# Patient Record
Sex: Female | Born: 1960 | Race: Black or African American | Hispanic: No | Marital: Single | Smoking: Never smoker
Health system: Southern US, Community
[De-identification: ages and names within clinical notes are randomized; demographics above are authoritative.]

## PROBLEM LIST (undated history)

## (undated) DIAGNOSIS — I10 Essential (primary) hypertension: Secondary | ICD-10-CM

## (undated) HISTORY — PX: BREAST REDUCTION SURGERY: SHX8

## (undated) HISTORY — PX: ABDOMINAL HYSTERECTOMY: SHX81

---

## 2011-12-13 ENCOUNTER — Emergency Department (HOSPITAL_BASED_OUTPATIENT_CLINIC_OR_DEPARTMENT_OTHER)
Admission: EM | Admit: 2011-12-13 | Discharge: 2011-12-14 | Disposition: A | Discharge status: Home / Self Care | Payer: PRIVATE HEALTH INSURANCE | Attending: Emergency Medicine | Admitting: Emergency Medicine

## 2011-12-13 ENCOUNTER — Encounter (HOSPITAL_BASED_OUTPATIENT_CLINIC_OR_DEPARTMENT_OTHER): Payer: Self-pay | Admitting: *Deleted

## 2011-12-13 DIAGNOSIS — X500XXA Overexertion from strenuous movement or load, initial encounter: Secondary | ICD-10-CM | POA: Insufficient documentation

## 2011-12-13 DIAGNOSIS — Y9289 Other specified places as the place of occurrence of the external cause: Secondary | ICD-10-CM | POA: Insufficient documentation

## 2011-12-13 DIAGNOSIS — I10 Essential (primary) hypertension: Secondary | ICD-10-CM | POA: Insufficient documentation

## 2011-12-13 DIAGNOSIS — Y990 Civilian activity done for income or pay: Secondary | ICD-10-CM | POA: Insufficient documentation

## 2011-12-13 DIAGNOSIS — M549 Dorsalgia, unspecified: Secondary | ICD-10-CM | POA: Insufficient documentation

## 2011-12-13 DIAGNOSIS — E119 Type 2 diabetes mellitus without complications: Secondary | ICD-10-CM | POA: Insufficient documentation

## 2011-12-13 DIAGNOSIS — T148XXA Other injury of unspecified body region, initial encounter: Secondary | ICD-10-CM | POA: Insufficient documentation

## 2011-12-13 DIAGNOSIS — J189 Pneumonia, unspecified organism: Secondary | ICD-10-CM | POA: Insufficient documentation

## 2011-12-13 HISTORY — DX: Essential (primary) hypertension: I10

## 2011-12-13 MED ORDER — TRAMADOL HCL 50 MG PO TABS
50.0000 mg | ORAL_TABLET | Freq: Once | ORAL | Status: AC
  Administered 2011-12-14: 50 mg via ORAL
  Filled 2011-12-13: qty 1

## 2011-12-13 NOTE — ED Provider Notes (Signed)
History  This chart was scribed for [name], MD by Shari Heritage. The patient was seen in room MH10/MH10. Patient's care was started at 2242.   CSN: [id number]  Arrival date & time 12/13/11  2242   None     Chief Complaint  Patient presents with  . Muscle Pain    Patient is a 51 y.o. female presenting with musculoskeletal pain. The history is provided by the patient. No language interpreter was used.  Muscle Pain This is a new problem. The current episode started 2 days ago. The problem occurs constantly. The problem has been gradually worsening. Pertinent negatives include no chest pain, no abdominal pain, no headaches and no shortness of breath. Associated symptoms comments: Not been bed bound no long car trips nor plane trips.  No DOE, no SOB, no f/c/r.  No cough. The symptoms are aggravated by bending and walking. The symptoms are relieved by position (bending to the left side.  ). She has tried nothing for the symptoms. The treatment provided no relief.    Jennifer Ford is a 51 y.o. female who presents to the Emergency Department complaining of intermittent moderate to severe back pain described as a catching sensation below the pposterior right 10-11  ribs onset several days ago while she was pushing a heavy wet mop at work. Patient reports that ambulation and other movements worsens pain leaning to the left side improved the pain.. She denies taking pain medication to improve symptoms. She denies taking any long car or plane trips recently. Patient denies hematuria, SOB, HA, chest pain, abdominal pain and cough.   Past Medical History  Diagnosis Date  . Diabetes mellitus   . Hypertension     Past Surgical History  Procedure Date  . Abdominal hysterectomy     History reviewed. No pertinent family history.  History  Substance Use Topics  . Smoking status: Never Smoker   . Smokeless tobacco: Not on file  . Alcohol Use: No    Review of Systems  Constitutional:  Negative for fever and chills.  HENT: Negative for congestion, sore throat and neck pain.   Eyes: Negative for visual disturbance.  Respiratory: Negative for cough, chest tightness, shortness of breath and wheezing.   Cardiovascular: Negative for chest pain, palpitations and leg swelling.  Gastrointestinal: Negative for nausea, vomiting, abdominal pain and diarrhea.  Genitourinary: Negative for dysuria, urgency and hematuria.  Musculoskeletal: Positive for back pain.  Skin: Negative for rash.  Neurological: Negative for seizures and headaches.  Hematological: Does not bruise/bleed easily.  Psychiatric/Behavioral: Negative for confusion.  All other systems reviewed and are negative.     Allergies  Review of patient's allergies indicates no known allergies.  Home Medications   Current Outpatient Rx  Name Route Sig Dispense Refill  . VITAMIN D 1000 UNITS PO TABS Oral Take 1,000 Units by mouth daily.    Marland Kitchen HYDROCHLOROTHIAZIDE 25 MG PO TABS Oral Take 25 mg by mouth daily.    Marland Kitchen LIDODERM EX Apply externally Apply 1 patch topically daily as needed.    Marland Kitchen METFORMIN HCL 500 MG PO TABS Oral Take 500 mg by mouth 2 (two) times daily with a meal.      Triage Vitals: BP 123/88  Pulse 84  Temp(Src) 97.8 F (36.6 C) (Oral)  Resp 18  Ht 5\' 3"  (1.6 m)  Wt 198 lb (89.812 kg)  BMI 35.07 kg/m2  SpO2 100%  Physical Exam  Constitutional: She is oriented to person, place, and time.  She appears well-developed and well-nourished.  HENT:  Head: Normocephalic and atraumatic.  Mouth/Throat: Oropharynx is clear and moist.       Moist mucus and no exudate.  Eyes: Conjunctivae and EOM are normal.  Neck: Normal range of motion. Neck supple.  Cardiovascular: Normal rate, regular rhythm and normal heart sounds.   Pulmonary/Chest: Breath sounds normal. No respiratory distress. She has no wheezes. She has no rales. She exhibits no tenderness.  Abdominal: Soft. Bowel sounds are normal. There is no  tenderness. There is no rebound and no guarding.  Musculoskeletal: She exhibits no edema.       5/5 upper strength bi step offs over the spine.    Neurological: She is alert and oriented to person, place, and time. She has normal reflexes.  Skin: Skin is warm and dry.  Psychiatric: She has a normal mood and affect. Her behavior is normal.    ED Course  Procedures (including critical care time)  DIAGNOSTIC STUDIES: Oxygen Saturation is 100% on room air, normal by my interpretation.    COORDINATION OF CARE: 11:34PM-Discussed x-ray and urinalysis with pt and pt agreed.  Labs Reviewed  URINALYSIS, ROUTINE W REFLEX MICROSCOPIC - Abnormal; Notable for the following:    APPearance TURBID (*)    All other components within normal limits  URINE MICROSCOPIC-ADD ON - Abnormal; Notable for the following:    Squamous Epithelial / LPF MANY (*)    All other components within normal limits   No results found.   No diagnosis found.    MDM  Will treat for muscle strain and pneumonia.  Return immediately for chest pain shortness of breath cough fevers chills or any concerns.  Follow up with your family doctor.  Patient verbalizes understanding and agrees to follow up  I personally performed the services described in this documentation, which was scribed in my presence. The recorded information has been reviewed and considered.         Jasmine Awe, MD 12/14/11 [phone]

## 2011-12-13 NOTE — ED Notes (Signed)
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 Pain increased with movement. Denies other s/s.

## 2011-12-13 NOTE — ED Notes (Signed)
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  Pt has no obvious injuries or deformites noted

## 2011-12-14 ENCOUNTER — Emergency Department (HOSPITAL_BASED_OUTPATIENT_CLINIC_OR_DEPARTMENT_OTHER): Payer: PRIVATE HEALTH INSURANCE

## 2011-12-14 ENCOUNTER — Emergency Department (INDEPENDENT_AMBULATORY_CARE_PROVIDER_SITE_OTHER): Payer: PRIVATE HEALTH INSURANCE

## 2011-12-14 DIAGNOSIS — M549 Dorsalgia, unspecified: Secondary | ICD-10-CM

## 2011-12-14 DIAGNOSIS — R918 Other nonspecific abnormal finding of lung field: Secondary | ICD-10-CM

## 2011-12-14 LAB — URINALYSIS, ROUTINE W REFLEX MICROSCOPIC
Glucose, UA: NEGATIVE mg/dL
Hgb urine dipstick: NEGATIVE
Specific Gravity, Urine: 1.018 (ref 1.005–1.030)
Urobilinogen, UA: 1 mg/dL (ref 0.0–1.0)
pH: 7 (ref 5.0–8.0)

## 2011-12-14 LAB — URINE MICROSCOPIC-ADD ON

## 2011-12-14 MED ORDER — OXYCODONE-ACETAMINOPHEN 5-325 MG PO TABS: 1.0000 | ORAL_TABLET | ORAL | Status: AC | PRN

## 2011-12-14 MED ORDER — AZITHROMYCIN 250 MG PO TABS
ORAL_TABLET | ORAL | Status: AC
Start: 2011-12-14 — End: 2011-12-19

## 2011-12-14 NOTE — Discharge Instructions (Signed)
Pneumonia, Adult Pneumonia is an infection of the lungs.  CAUSES Pneumonia may be caused by bacteria or a virus. Usually, these infections are caused by breathing infectious particles into the lungs (respiratory tract). SYMPTOMS   Cough.   Fever.   Chest pain.   Increased rate of breathing.   Wheezing.   Mucus production.  DIAGNOSIS  If you have the common symptoms of pneumonia, your caregiver will typically confirm the diagnosis with a chest X-ray. The X-ray will show an abnormality in the lung (pulmonary infiltrate) if you have pneumonia. Other tests of your blood, urine, or sputum may be done to find the specific cause of your pneumonia. Your caregiver may also do tests (blood gases or pulse oximetry) to see how well your lungs are working. TREATMENT  Some forms of pneumonia may be spread to other people when you cough or sneeze. You may be asked to wear a mask before and during your exam. Pneumonia that is caused by bacteria is treated with antibiotic medicine. Pneumonia that is caused by the influenza virus may be treated with an antiviral medicine. Most other viral infections must run their course. These infections will not respond to antibiotics.  PREVENTION A pneumococcal shot (vaccine) is available to prevent a common bacterial cause of pneumonia. This is usually suggested for:  People over 65 years old.   Patients on chemotherapy.   People with chronic lung problems, such as bronchitis or emphysema.   People with immune system problems.  If you are over 65 or have a high risk condition, you may receive the pneumococcal vaccine if you have not received it before. In some countries, a routine influenza vaccine is also recommended. This vaccine can help prevent some cases of pneumonia.You may be offered the influenza vaccine as part of your care. If you smoke, it is time to quit. You may receive instructions on how to stop smoking. Your caregiver can provide medicines and  counseling to help you quit. HOME CARE INSTRUCTIONS   Cough suppressants may be used if you are losing too much rest. However, coughing protects you by clearing your lungs. You should avoid using cough suppressants if you can.   Your caregiver may have prescribed medicine if he or she thinks your pneumonia is caused by a bacteria or influenza. Finish your medicine even if you start to feel better.   Your caregiver may also prescribe an expectorant. This loosens the mucus to be coughed up.   Only take over-the-counter or prescription medicines for pain, discomfort, or fever as directed by your caregiver.   Do not smoke. Smoking is a common cause of bronchitis and can contribute to pneumonia. If you are a smoker and continue to smoke, your cough may last several weeks after your pneumonia has cleared.   A cold steam vaporizer or humidifier in your room or home may help loosen mucus.   Coughing is often worse at night. Sleeping in a semi-upright position in a recliner or using a couple pillows under your head will help with this.   Get rest as you feel it is needed. Your body will usually let you know when you need to rest.  SEEK IMMEDIATE MEDICAL CARE IF:   Your illness becomes worse. This is especially true if you are elderly or weakened from any other disease.   You cannot control your cough with suppressants and are losing sleep.   You begin coughing up blood.   You develop pain which is getting worse or   is uncontrolled with medicines.   You have a fever.   Any of the symptoms which initially brought you in for treatment are getting worse rather than better.   You develop shortness of breath or chest pain.  MAKE SURE YOU:   Understand these instructions.   Will watch your condition.   Will get help right away if you are not doing well or get worse.  Document Released: 08/03/2005 Document Revised: 07/23/2011 Document Reviewed: 10/23/2010 [facility] Patient Information 2012  [place], [place].Ligament Sprain Ligaments are tough, fibrous tissues that hold bones together at the joints. A sprain can occur when a ligament is stretched. This injury may take several weeks to heal. HOME CARE INSTRUCTIONS   Rest the injured area for as long as directed by your caregiver. Then slowly start using the joint as directed by your caregiver and as the pain allows.   Keep the affected joint raised if possible to lessen swelling.   Apply ice for 15 to 20 minutes to the injured area every couple hours for the first half day, then 3 to 4 times per day for the first 48 hours. Put the ice in a plastic bag and place a towel between the bag of ice and your skin.   Wear any splinting, casting, or elastic bandage applications as instructed.   Only take over-the-counter or prescription medicines for pain, discomfort, or fever as directed by your caregiver. Do not use aspirin immediately after the injury unless instructed by your caregiver. Aspirin can cause increased bleeding and bruising of the tissues.   If you were given crutches, continue to use them as instructed and do not resume weight bearing on the affected extremity until instructed.  SEEK MEDICAL CARE IF:   Your bruising, swelling, or pain increases.   You have cold and numb fingers or toes if your arm or leg was injured.  SEEK IMMEDIATE MEDICAL CARE IF:   Your toes are numb or blue if your leg was injured.   Your fingers are numb or blue if your arm was injured.   Your pain is not responding to medicines and continues to stay the same or gets worse.  MAKE SURE YOU:   Understand these instructions.   Will watch your condition.   Will get help right away if you are not doing well or get worse.  Document Released: 07/31/2000 Document Revised: 07/23/2011 Document Reviewed: 05/29/2008 [employer] Patient Information 2012 [place], [place].

## 2013-11-05 IMAGING — CR DG CHEST 2V
2 series · 2 of 2 positions shown · non-contrast
Comparison: None.

CLINICAL DATA: Right-sided back pain.

CHEST - 2 VIEW

[w chest pa]
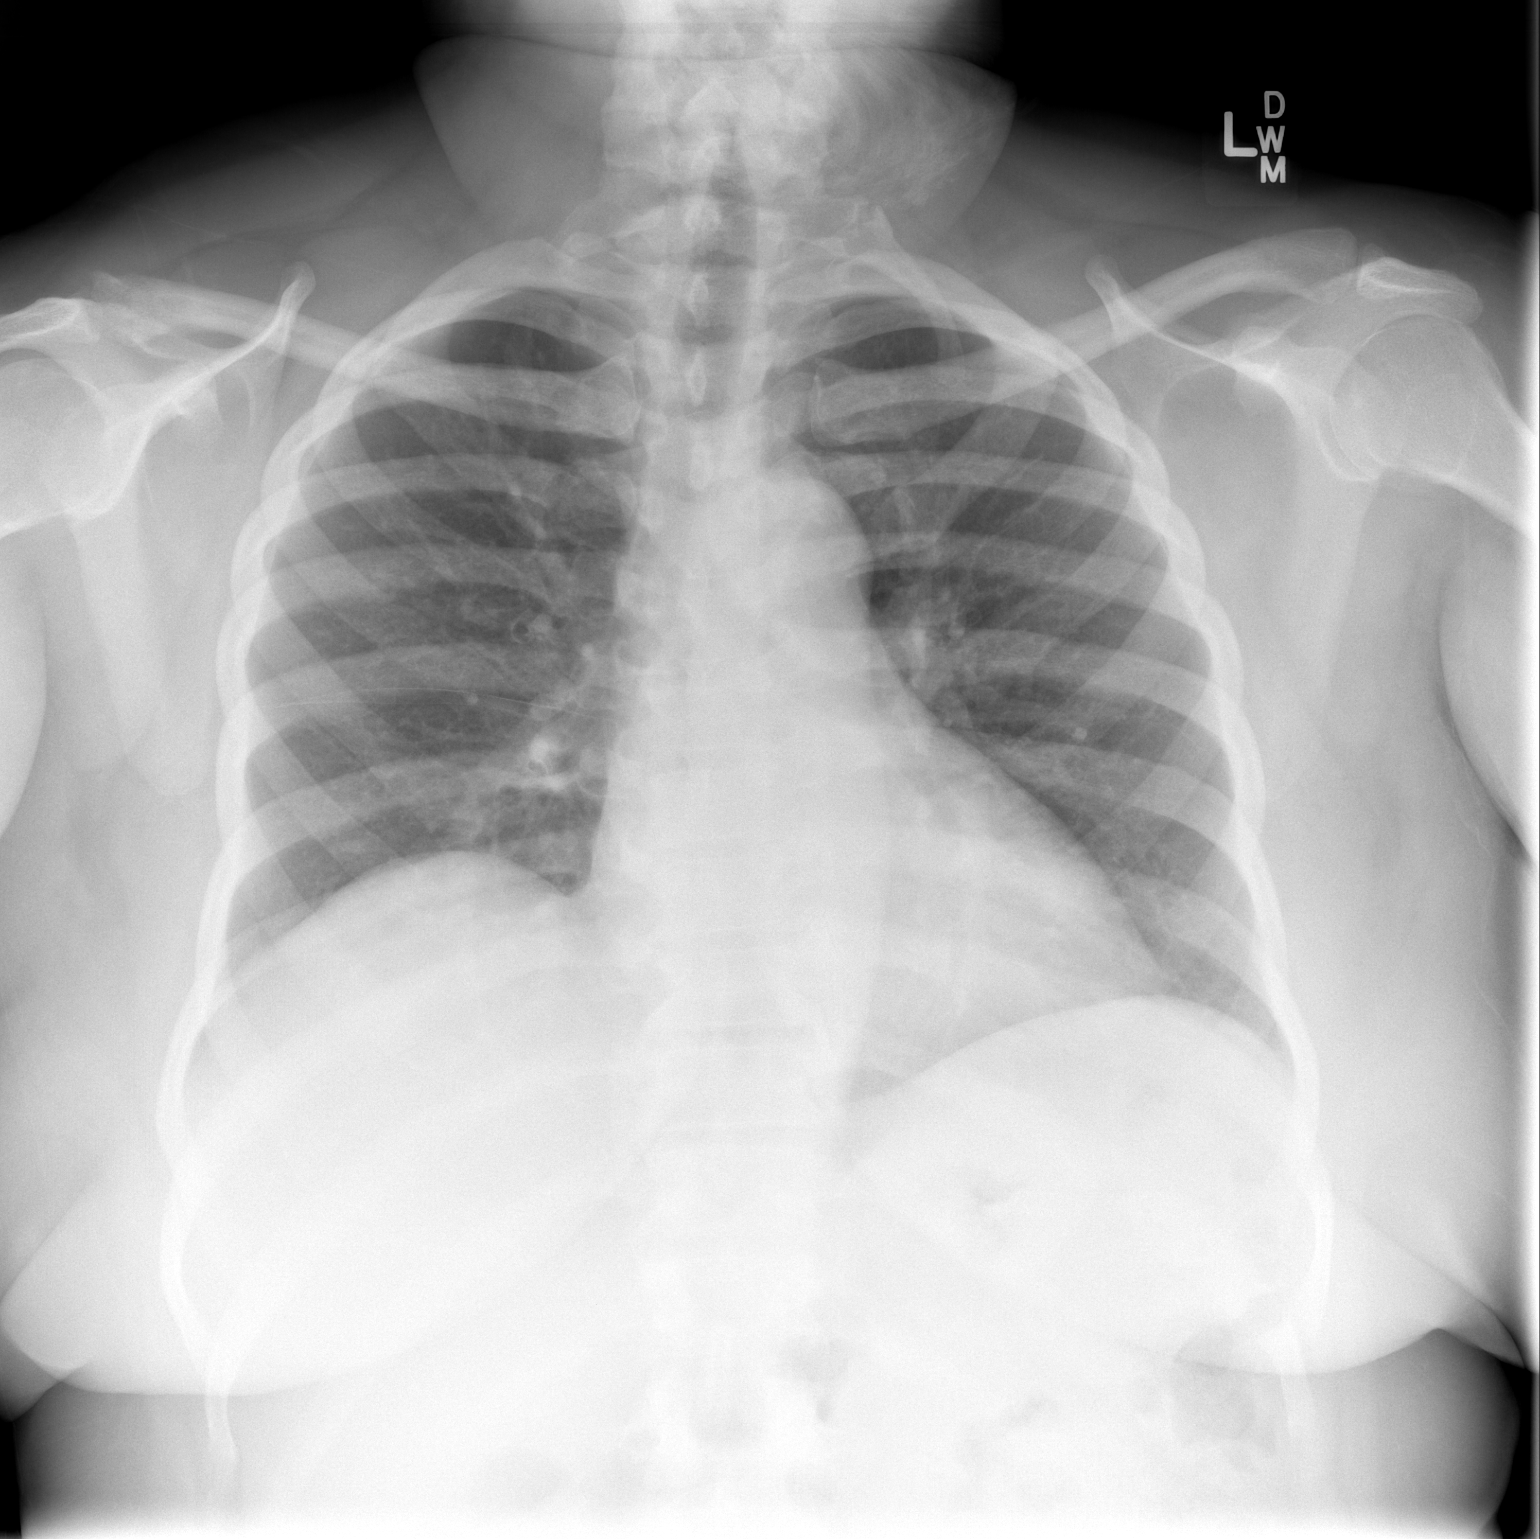

[w chest lat]
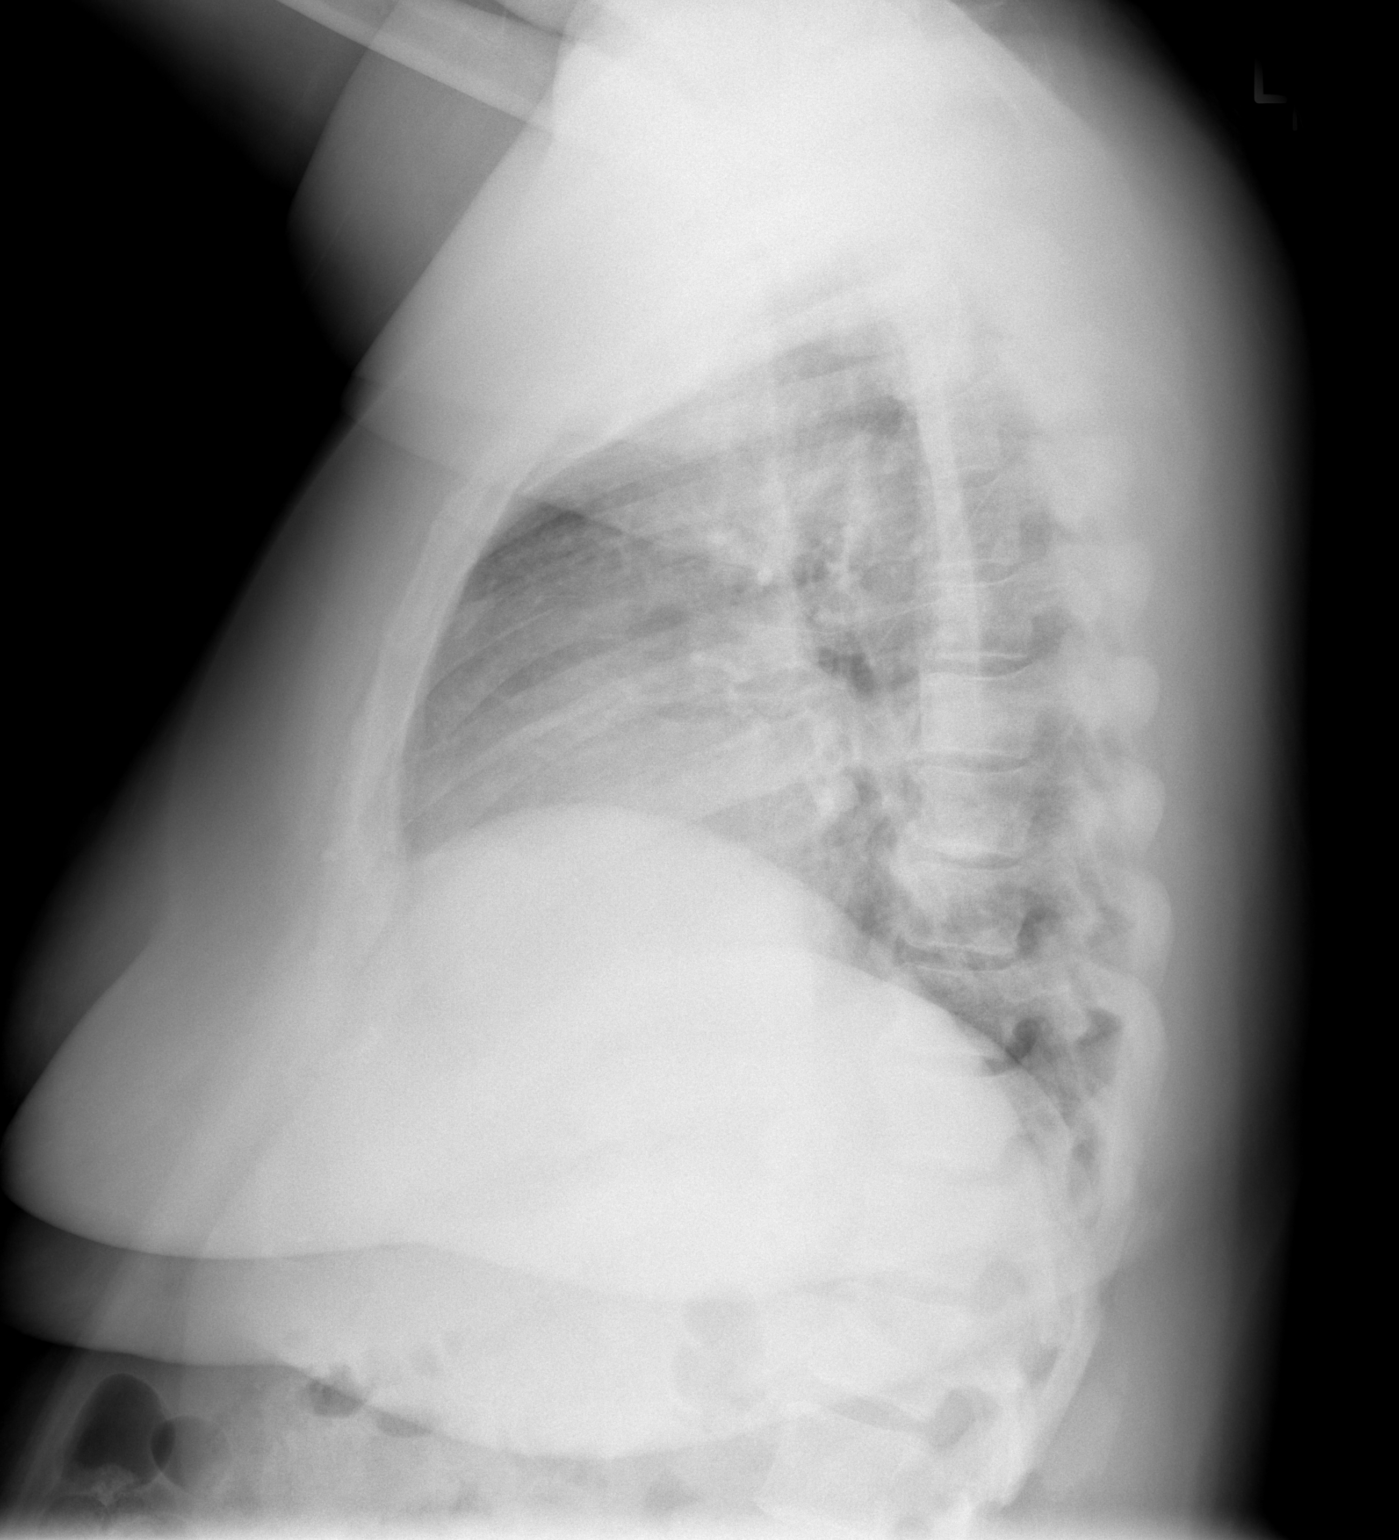

[2 of 2 positions shown; findings below may reference images not displayed]

FINDINGS: Hypoaeration with interstitial crowding.  Mild streaky
retrocardiac opacity.  No pleural effusion or pneumothorax.
Cervical ribs. No acute osseous abnormality identified.
IMPRESSION: Mild retrocardiac opacity, likely atelectasis.  Early infiltrate
not excluded in the appropriate clinical setting.

## 2015-10-24 ENCOUNTER — Encounter (HOSPITAL_COMMUNITY): Payer: Self-pay | Admitting: Emergency Medicine

## 2015-10-24 ENCOUNTER — Emergency Department (HOSPITAL_COMMUNITY)
Admission: EM | Admit: 2015-10-24 | Discharge: 2015-10-24 | Disposition: A | Discharge status: Home / Self Care | Payer: Self-pay | Attending: Emergency Medicine | Admitting: Emergency Medicine

## 2015-10-24 ENCOUNTER — Emergency Department (HOSPITAL_COMMUNITY): Payer: Self-pay

## 2015-10-24 DIAGNOSIS — Y9289 Other specified places as the place of occurrence of the external cause: Secondary | ICD-10-CM | POA: Insufficient documentation

## 2015-10-24 DIAGNOSIS — M545 Low back pain, unspecified: Secondary | ICD-10-CM

## 2015-10-24 DIAGNOSIS — Y998 Other external cause status: Secondary | ICD-10-CM | POA: Insufficient documentation

## 2015-10-24 DIAGNOSIS — E119 Type 2 diabetes mellitus without complications: Secondary | ICD-10-CM | POA: Insufficient documentation

## 2015-10-24 DIAGNOSIS — Z7984 Long term (current) use of oral hypoglycemic drugs: Secondary | ICD-10-CM | POA: Insufficient documentation

## 2015-10-24 DIAGNOSIS — I10 Essential (primary) hypertension: Secondary | ICD-10-CM | POA: Insufficient documentation

## 2015-10-24 DIAGNOSIS — R55 Syncope and collapse: Secondary | ICD-10-CM | POA: Insufficient documentation

## 2015-10-24 DIAGNOSIS — Y9389 Activity, other specified: Secondary | ICD-10-CM | POA: Insufficient documentation

## 2015-10-24 DIAGNOSIS — Z79899 Other long term (current) drug therapy: Secondary | ICD-10-CM | POA: Insufficient documentation

## 2015-10-24 DIAGNOSIS — S3992XA Unspecified injury of lower back, initial encounter: Secondary | ICD-10-CM | POA: Insufficient documentation

## 2015-10-24 DIAGNOSIS — W1839XA Other fall on same level, initial encounter: Secondary | ICD-10-CM | POA: Insufficient documentation

## 2015-10-24 DIAGNOSIS — W19XXXA Unspecified fall, initial encounter: Secondary | ICD-10-CM

## 2015-10-24 MED ORDER — KETOROLAC TROMETHAMINE 30 MG/ML IJ SOLN
30.0000 mg | Freq: Once | INTRAMUSCULAR | Status: AC
  Administered 2015-10-24: 30 mg via INTRAMUSCULAR
  Filled 2015-10-24: qty 1

## 2015-10-24 MED ORDER — HYDROMORPHONE HCL 1 MG/ML IJ SOLN
1.0000 mg | Freq: Once | INTRAMUSCULAR | Status: AC
  Administered 2015-10-24: 1 mg via INTRAMUSCULAR
  Filled 2015-10-24: qty 1

## 2015-10-24 MED ORDER — TRAMADOL HCL 50 MG PO TABS: 50.0000 mg | ORAL_TABLET | Freq: Four times a day (QID) | ORAL | Status: DC | PRN

## 2015-10-24 MED ORDER — DIAZEPAM 2 MG PO TABS
2.0000 mg | ORAL_TABLET | Freq: Once | ORAL | Status: AC
  Administered 2015-10-24: 2 mg via ORAL
  Filled 2015-10-24: qty 1

## 2015-10-24 NOTE — Discharge Instructions (Signed)

## 2015-10-24 NOTE — ED Notes (Signed)
Bed: [id number] Expected date:  Expected time:  Means of arrival:  Comments: EMS- 54yo F, fall

## 2015-10-24 NOTE — ED Notes (Addendum)
Per EMS c/o low back pain/coccyx pain after pt fell at 0500 today backwards, pt was seen by EMS at that time and only had lower back pain. At 0900 patient needed to lay on floor due to pain and could not stand up so called EMS.   In contrast to EMS report, pt states syncope was cause of fall, states she hit her head and lost consciousness for 3 minutes, witnessed by family member.

## 2015-10-28 NOTE — ED Provider Notes (Signed)
CSN: [id number]     Arrival date & time 10/24/15  4098 History   First MD Initiated Contact with Patient 10/24/15 1034     Chief Complaint  Patient presents with  . Fall  . Loss of Consciousness     (Consider location/radiation/quality/duration/timing/severity/associated sxs/prior Treatment) HPI 55 year old female with lower back pain. Patient followed approximately 5:00 this morning. She fell backwards onto her butt. She has had persistent lower back and buttock pain since then. Pain is sniffily worse with any type of movement. She does not think she hit her head. Denies any significant headache, neck pain. No acute numbness, tingling or focal loss of strength. No intervention prior to arrival.  Past Medical History  Diagnosis Date  . Diabetes mellitus   . Hypertension    Past Surgical History  Procedure Laterality Date  . Abdominal hysterectomy     History reviewed. No pertinent family history. Social History  Substance Use Topics  . Smoking status: Never Smoker   . Smokeless tobacco: None  . Alcohol Use: No   OB History    No data available     Review of Systems  All systems reviewed and negative, other than as noted in HPI.   Allergies  Review of patient's allergies indicates no known allergies.  Home Medications   Prior to Admission medications   Medication Sig Start Date End Date Taking? Authorizing Provider  cholecalciferol (VITAMIN D) 1000 UNITS tablet Take 1,000 Units by mouth daily.   Yes Historical Provider, MD  efavirenz-emtricitabine-tenofovir (ATRIPLA) 600-200-300 MG tablet Take 1 tablet by mouth at bedtime.   Yes Historical Provider, MD  glipiZIDE (GLUCOTROL) 5 MG tablet Take 5 mg by mouth 2 (two) times daily.    Yes Historical Provider, MD  hydrochlorothiazide (HYDRODIURIL) 25 MG tablet Take 25 mg by mouth daily.   Yes Historical Provider, MD  lisinopril (PRINIVIL,ZESTRIL) 5 MG tablet Take 5 mg by mouth daily.   Yes Historical Provider, MD  metFORMIN  (GLUCOPHAGE) 500 MG tablet Take 1,000 mg by mouth 2 (two) times daily with a meal.    Yes Historical Provider, MD  Multiple Vitamin (MULTIVITAMIN WITH MINERALS) TABS tablet Take 1 tablet by mouth daily.   Yes Historical Provider, MD  pravastatin (PRAVACHOL) 10 MG tablet Take 10 mg by mouth daily.   Yes Historical Provider, MD  traMADol (ULTRAM) 50 MG tablet Take 1 tablet (50 mg total) by mouth every 6 (six) hours as needed. 10/24/15   Raeford Razor, MD   BP 91/55 mmHg  Pulse 66  Temp(Src) 97.8 F (36.6 C) (Oral)  Resp 20  SpO2 98% Physical Exam  Constitutional: She appears well-developed and well-nourished. No distress.  HENT:  Head: Normocephalic and atraumatic.  Eyes: Conjunctivae are normal. Right eye exhibits no discharge. Left eye exhibits no discharge.  Neck: Neck supple.  Cardiovascular: Normal rate, regular rhythm and normal heart sounds.  Exam reveals no gallop and no friction rub.   No murmur heard. Pulmonary/Chest: Effort normal and breath sounds normal. No respiratory distress.  Abdominal: Soft. She exhibits no distension. There is no tenderness.  Musculoskeletal: She exhibits no edema or tenderness.  Mild tenderness to palpation lumbar spine and sacrum. No step-off or deformity. Overlying skin lesions. Strength is 5 out of 5 bilateral lower extremities. Sensation is intact to light touch. 1+ patellar reflexes bilaterally.  Neurological: She is alert.  Skin: Skin is warm and dry.  Psychiatric: She has a normal mood and affect. Her behavior is normal. Thought content normal.  Nursing note and vitals reviewed.   ED Course  Procedures (including critical care time) Labs Review Labs Reviewed - No data to display  Imaging Review No results found. I have personally reviewed and evaluated these images and lab results as part of my medical decision-making.   EKG Interpretation   Date/Time:  Thursday October 24 2015 10:26:52 EST Ventricular Rate:  78 PR Interval:  164 QRS  Duration: 94 QT Interval:  388 QTC Calculation: 442 R Axis:   -64 Text Interpretation:  Sinus rhythm Left anterior fascicular block Abnormal  R-wave progression, late transition ED PHYSICIAN INTERPRETATION AVAILABLE  IN [facility] Confirmed by TEST, Record ([id number]) on 10/25/2015 6:56:11  AM      MDM   Final diagnoses:  Fall, initial encounter  Right-sided low back pain without sciatica        Raeford RazorStephen Deeann Servidio, MD 10/28/15 [phone]

## 2019-10-17 ENCOUNTER — Ambulatory Visit: Payer: Self-pay

## 2019-10-19 ENCOUNTER — Ambulatory Visit: Payer: Self-pay | Attending: Internal Medicine

## 2019-10-19 DIAGNOSIS — Z23 Encounter for immunization: Secondary | ICD-10-CM | POA: Insufficient documentation

## 2019-10-19 NOTE — Progress Notes (Signed)
   Covid-19 Vaccination Clinic  Name:  Jennifer Ford    MRN: [id number] DOB: 09-28-1960  10/19/2019  Jennifer Ford was observed post Covid-19 immunization for 15 minutes without incident. She was provided with Vaccine Information Sheet and instruction to access the V-Safe system.   Jennifer Ford was instructed to call 911 with any severe reactions post vaccine: Marland Kitchen Difficulty breathing  . Swelling of face and throat  . A fast heartbeat  . A bad rash all over body  . Dizziness and weakness   Immunizations Administered    Name Date Dose VIS Date Route   Pfizer COVID-19 Vaccine 10/19/2019  3:34 PM 0.3 mL 07/28/2019 Intramuscular   Manufacturer: [employer], [employer]   Lot: [id number]   NDC: [address]-0

## 2019-11-15 ENCOUNTER — Ambulatory Visit: Payer: Self-pay | Attending: Internal Medicine

## 2019-11-15 DIAGNOSIS — Z23 Encounter for immunization: Secondary | ICD-10-CM

## 2019-11-15 NOTE — Progress Notes (Signed)
   Covid-19 Vaccination Clinic  Name:  Jennifer Ford    MRN: [id number] DOB: [birth year]  11/15/2019  [name] was observed post Covid-19 immunization for 15 minutes without incident. She was provided with Vaccine Information Sheet and instruction to access the V-Safe system.   [name] was instructed to call 911 with any severe reactions post vaccine: Marland Kitchen Difficulty breathing  . Swelling of face and throat  . A fast heartbeat  . A bad rash all over body  . Dizziness and weakness   Immunizations Administered    Name Date Dose VIS Date Route   Pfizer COVID-19 Vaccine 11/15/2019  8:25 AM 0.3 mL 07/28/2019 Intramuscular   Manufacturer: [employer], [employer]   Lot: [id number]   NDC: [address]-1

## 2020-01-29 ENCOUNTER — Ambulatory Visit: Payer: Self-pay | Admitting: Family Medicine

## 2020-02-29 ENCOUNTER — Other Ambulatory Visit: Payer: Self-pay

## 2020-02-29 ENCOUNTER — Other Ambulatory Visit: Payer: Self-pay | Admitting: Internal Medicine

## 2020-02-29 ENCOUNTER — Encounter: Payer: Self-pay | Admitting: Internal Medicine

## 2020-02-29 ENCOUNTER — Ambulatory Visit: Payer: Self-pay | Attending: Family Medicine | Admitting: Internal Medicine

## 2020-02-29 VITALS — BP 127/76 | HR 84 | Resp 16 | Ht 62.0 in | Wt 209.2 lb

## 2020-02-29 DIAGNOSIS — I10 Essential (primary) hypertension: Secondary | ICD-10-CM

## 2020-02-29 DIAGNOSIS — Z1331 Encounter for screening for depression: Secondary | ICD-10-CM

## 2020-02-29 DIAGNOSIS — Z1211 Encounter for screening for malignant neoplasm of colon: Secondary | ICD-10-CM

## 2020-02-29 DIAGNOSIS — Z6838 Body mass index (BMI) 38.0-38.9, adult: Secondary | ICD-10-CM

## 2020-02-29 DIAGNOSIS — Z21 Asymptomatic human immunodeficiency virus [HIV] infection status: Secondary | ICD-10-CM | POA: Insufficient documentation

## 2020-02-29 DIAGNOSIS — E1159 Type 2 diabetes mellitus with other circulatory complications: Secondary | ICD-10-CM | POA: Insufficient documentation

## 2020-02-29 DIAGNOSIS — E785 Hyperlipidemia, unspecified: Secondary | ICD-10-CM

## 2020-02-29 DIAGNOSIS — I152 Hypertension secondary to endocrine disorders: Secondary | ICD-10-CM | POA: Insufficient documentation

## 2020-02-29 DIAGNOSIS — E1169 Type 2 diabetes mellitus with other specified complication: Secondary | ICD-10-CM

## 2020-02-29 DIAGNOSIS — R809 Proteinuria, unspecified: Secondary | ICD-10-CM

## 2020-02-29 DIAGNOSIS — E1142 Type 2 diabetes mellitus with diabetic polyneuropathy: Secondary | ICD-10-CM

## 2020-02-29 DIAGNOSIS — E1129 Type 2 diabetes mellitus with other diabetic kidney complication: Secondary | ICD-10-CM

## 2020-02-29 LAB — POCT GLYCOSYLATED HEMOGLOBIN (HGB A1C): HbA1c, POC (controlled diabetic range): 10.2 % — AB (ref 0.0–7.0)

## 2020-02-29 LAB — GLUCOSE, POCT (MANUAL RESULT ENTRY): POC Glucose: 345 mg/dl — AB (ref 70–99)

## 2020-02-29 MED ORDER — TRUE METRIX BLOOD GLUCOSE TEST VI STRP: ORAL_STRIP | 12 refills | Status: DC

## 2020-02-29 MED ORDER — LANTUS SOLOSTAR 100 UNIT/ML ~~LOC~~ SOPN: 12.0000 [IU] | PEN_INJECTOR | Freq: Every day | SUBCUTANEOUS | 99 refills | Status: DC

## 2020-02-29 MED ORDER — TRUE METRIX METER W/DEVICE KIT: PACK | 0 refills | Status: DC

## 2020-02-29 MED ORDER — PEN NEEDLES 31G X 8 MM MISC: 6 refills | Status: DC

## 2020-02-29 MED ORDER — TRUEPLUS LANCETS 28G MISC
4 refills | Status: DC
Start: 2020-02-29 — End: 2021-08-22

## 2020-02-29 MED FILL — TRUEplus LANCETS 28G MISC: 25 days supply | Qty: 100 | Fill #0

## 2020-02-29 MED FILL — !LANTUS SOLOSTAR 100UNITS/M: 100 | 25 days supply | Qty: 3 | Fill #0

## 2020-02-29 MED FILL — !TRUE METRIX BLOOD GLUCOSE: 30 days supply | Qty: 1 | Fill #0

## 2020-02-29 MED FILL — TRUEPLUS PEN NDL 31GX5/16: 31G X 8 MM | 25 days supply | Qty: 100 | Fill #0

## 2020-02-29 MED FILL — TRUE METRIX TEST STRIP: 25 days supply | Qty: 100 | Fill #0

## 2020-02-29 NOTE — Patient Instructions (Signed)
Stop glipizide. Start Lantus insulin 12 units at bedtime. Check your blood sugars before breakfast and before dinner.  Blood sugar goals before meals is between 90-130.  If you check after meal, check 2 hours after.  The goal 2 hours after meal is less than 180.   Diabetes Mellitus and Nutrition, Adult When you have diabetes (diabetes mellitus), it is very important to have healthy eating habits because your blood sugar (glucose) levels are greatly affected by what you eat and drink. Eating healthy foods in the appropriate amounts, at about the same times every day, can help you:  Control your blood glucose.  Lower your risk of heart disease.  Improve your blood pressure.  Reach or maintain a healthy weight. Every person with diabetes is different, and each person has different needs for a meal plan. Your health care provider may recommend that you work with a diet and nutrition specialist (dietitian) to make a meal plan that is best for you. Your meal plan may vary depending on factors such as:  The calories you need.  The medicines you take.  Your weight.  Your blood glucose, blood pressure, and cholesterol levels.  Your activity level.  Other health conditions you have, such as heart or kidney disease. How do carbohydrates affect me? Carbohydrates, also called carbs, affect your blood glucose level more than any other type of food. Eating carbs naturally raises the amount of glucose in your blood. Carb counting is a method for keeping track of how many carbs you eat. Counting carbs is important to keep your blood glucose at a healthy level, especially if you use insulin or take certain oral diabetes medicines. It is important to know how many carbs you can safely have in each meal. This is different for every person. Your dietitian can help you calculate how many carbs you should have at each meal and for each snack. Foods that contain carbs include:  Bread, cereal, rice, pasta,  and crackers.  Potatoes and corn.  Peas, beans, and lentils.  Milk and yogurt.  Fruit and juice.  Desserts, such as cakes, cookies, ice cream, and candy. How does alcohol affect me? Alcohol can cause a sudden decrease in blood glucose (hypoglycemia), especially if you use insulin or take certain oral diabetes medicines. Hypoglycemia can be a life-threatening condition. Symptoms of hypoglycemia (sleepiness, dizziness, and confusion) are similar to symptoms of having too much alcohol. If your health care provider says that alcohol is safe for you, follow these guidelines:  Limit alcohol intake to no more than 1 drink per day for nonpregnant women and 2 drinks per day for men. One drink equals 12 oz of beer, 5 oz of wine, or 1 oz of hard liquor.  Do not drink on an empty stomach.  Keep yourself hydrated with water, diet soda, or unsweetened iced tea.  Keep in mind that regular soda, juice, and other mixers may contain a lot of sugar and must be counted as carbs. What are tips for following this plan?  Reading food labels  Start by checking the serving size on the "Nutrition Facts" label of packaged foods and drinks. The amount of calories, carbs, fats, and other nutrients listed on the label is based on one serving of the item. Many items contain more than one serving per package.  Check the total grams (g) of carbs in one serving. You can calculate the number of servings of carbs in one serving by dividing the total carbs by 15. For  example, if a food has 30 g of total carbs, it would be equal to 2 servings of carbs.  Check the number of grams (g) of saturated and trans fats in one serving. Choose foods that have low or no amount of these fats.  Check the number of milligrams (mg) of salt (sodium) in one serving. Most people should limit total sodium intake to less than 2,300 mg per day.  Always check the nutrition information of foods labeled as "low-fat" or "nonfat". These foods  may be higher in added sugar or refined carbs and should be avoided.  Talk to your dietitian to identify your daily goals for nutrients listed on the label. Shopping  Avoid buying canned, premade, or processed foods. These foods tend to be high in fat, sodium, and added sugar.  Shop around the outside edge of the grocery store. This includes fresh fruits and vegetables, bulk grains, fresh meats, and fresh dairy. Cooking  Use low-heat cooking methods, such as baking, instead of high-heat cooking methods like deep frying.  Cook using healthy oils, such as olive, canola, or sunflower oil.  Avoid cooking with butter, cream, or high-fat meats. Meal planning  Eat meals and snacks regularly, preferably at the same times every day. Avoid going long periods of time without eating.  Eat foods high in fiber, such as fresh fruits, vegetables, beans, and whole grains. Talk to your dietitian about how many servings of carbs you can eat at each meal.  Eat 4-6 ounces (oz) of lean protein each day, such as lean meat, chicken, fish, eggs, or tofu. One oz of lean protein is equal to: ? 1 oz of meat, chicken, or fish. ? 1 egg. ?  cup of tofu.  Eat some foods each day that contain healthy fats, such as avocado, nuts, seeds, and fish. Lifestyle  Check your blood glucose regularly.  Exercise regularly as told by your health care provider. This may include: ? 150 minutes of moderate-intensity or vigorous-intensity exercise each week. This could be brisk walking, biking, or water aerobics. ? Stretching and doing strength exercises, such as yoga or weightlifting, at least 2 times a week.  Take medicines as told by your health care provider.  Do not use any products that contain nicotine or tobacco, such as cigarettes and e-cigarettes. If you need help quitting, ask your health care provider.  Work with a Social worker or diabetes educator to identify strategies to manage stress and any emotional and social  challenges. Questions to ask a health care provider  Do I need to meet with a diabetes educator?  Do I need to meet with a dietitian?  What number can I call if I have questions?  When are the best times to check my blood glucose? Where to find more information:  American Diabetes Association: diabetes.org  Academy of Nutrition and Dietetics: www.eatright.[employer] of Diabetes and Digestive and Kidney Diseases (NIH): [employer] Summary  A healthy meal plan will help you control your blood glucose and maintain a healthy lifestyle.  Working with a diet and nutrition specialist (dietitian) can help you make a meal plan that is best for you.  Keep in mind that carbohydrates (carbs) and alcohol have immediate effects on your blood glucose levels. It is important to count carbs and to use alcohol carefully. This information is not intended to replace advice given to you by your health care provider. Make sure you discuss any questions you have with your health care provider. Document Revised:  07/16/2017 Document Reviewed: 09/07/2016 Elsevier Patient Education  2020 Elsevier Inc.   Diabetes Mellitus and Standards of Medical Care Managing diabetes (diabetes mellitus) can be complicated. Your diabetes treatment may be managed by a team of health care providers, including:  A physician who specializes in diabetes (endocrinologist).  A nurse practitioner or physician assistant.  Nurses.  A diet and nutrition specialist (registered dietitian).  A certified diabetes educator (CDE).  An exercise specialist.  A pharmacist.  An eye doctor.  A foot specialist (podiatrist).  A dentist.  A primary care provider.  A mental health provider. Your health care providers follow guidelines to help you get the best quality of care. The following schedule is a general guideline for your diabetes management plan. Your health care providers may give you more specific  instructions. Physical exams Upon being diagnosed with diabetes mellitus, and each year after that, your health care provider will ask about your medical and family history. He or she will also do a physical exam. Your exam may include:  Measuring your height, weight, and body mass index (BMI).  Checking your blood pressure. This will be done at every routine medical visit. Your target blood pressure may vary depending on your medical conditions, your age, and other factors.  Thyroid gland exam.  Skin exam.  Screening for damage to your nerves (peripheral neuropathy). This may include checking the pulse in your legs and feet and checking the level of sensation in your hands and feet.  A complete foot exam to inspect the structure and skin of your feet, including checking for cuts, bruises, redness, blisters, sores, or other problems.  Screening for blood vessel (vascular) problems, which may include checking the pulse in your legs and feet and checking your temperature. Blood tests Depending on your treatment plan and your personal needs, you may have the following tests done:  HbA1c (hemoglobin A1c). This test provides information about blood sugar (glucose) control over the previous 2-3 months. It is used to adjust your treatment plan, if needed. This test will be done: ? At least 2 times a year, if you are meeting your treatment goals. ? 4 times a year, if you are not meeting your treatment goals or if treatment goals have changed.  Lipid testing, including total, LDL, and HDL cholesterol and triglyceride levels. ? The goal for LDL is less than 100 mg/dL (5.5 mmol/L). If you are at high risk for complications, the goal is less than 70 mg/dL (3.9 mmol/L). ? The goal for HDL is 40 mg/dL (2.2 mmol/L) or higher for men and 50 mg/dL (2.8 mmol/L) or higher for women. An HDL cholesterol of 60 mg/dL (3.3 mmol/L) or higher gives some protection against heart disease. ? The goal for triglycerides  is less than 150 mg/dL (8.3 mmol/L).  Liver function tests.  Kidney function tests.  Thyroid function tests. Dental and eye exams  Visit your dentist two times a year.  If you have type 1 diabetes, your health care provider may recommend an eye exam 3-5 years after you are diagnosed, and then once a year after your first exam. ? For children with type 1 diabetes, a health care provider may recommend an eye exam when your child is age 64 or older and has had diabetes for 3-5 years. After the first exam, your child should get an eye exam once a year.  If you have type 2 diabetes, your health care provider may recommend an eye exam as soon as you are  diagnosed, and then once a year after your first exam. Immunizations   The yearly flu (influenza) vaccine is recommended for everyone 6 months or older who has diabetes.  The pneumonia (pneumococcal) vaccine is recommended for everyone 2 years or older who has diabetes. If you are 47 or older, you may get the pneumonia vaccine as a series of two separate shots.  The hepatitis B vaccine is recommended for adults shortly after being diagnosed with diabetes.  Adults and children with diabetes should receive all other vaccines according to age-specific recommendations from the Centers for Disease Control and Prevention (CDC). Mental and emotional health Screening for symptoms of eating disorders, anxiety, and depression is recommended at the time of diagnosis and afterward as needed. If your screening shows that you have symptoms (positive screening result), you may need more evaluation and you may work with a mental health care provider. Treatment plan Your treatment plan will be reviewed at every medical visit. You and your health care provider will discuss:  How you are taking your medicines, including insulin.  Any side effects you are experiencing.  Your blood glucose target goals.  The frequency of your blood glucose  monitoring.  Lifestyle habits, such as activity level as well as tobacco, alcohol, and substance use. Diabetes self-management education Your health care provider will assess how well you are monitoring your blood glucose levels and whether you are taking your insulin correctly. He or she may refer you to:  A certified diabetes educator to manage your diabetes throughout your life, starting at diagnosis.  A registered dietitian who can create or review your personal nutrition plan.  An exercise specialist who can discuss your activity level and exercise plan. Summary  Managing diabetes (diabetes mellitus) can be complicated. Your diabetes treatment may be managed by a team of health care providers.  Your health care providers follow guidelines in order to help you get the best quality of care.  Standards of care including having regular physical exams, blood tests, blood pressure monitoring, immunizations, screening tests, and education about how to manage your diabetes.  Your health care providers may also give you more specific instructions based on your individual health. This information is not intended to replace advice given to you by your health care provider. Make sure you discuss any questions you have with your health care provider. Document Revised: 04/22/2018 Document Reviewed: 05/01/2016 Elsevier Patient Education  2020 [employer].

## 2020-02-29 NOTE — Progress Notes (Signed)
Patient ID: Jennifer Ford, female    DOB: February 04, 1961  MRN: [id number]  CC: Diabetes and Hypertension   Subjective: [name] is a 59 y.o. female who presents for new pt visit.  Her concerns today include:  Pt with hx of HTN, DM with micoralbumin, HL, HIV  No previous PCP. [facility].    DIABETES TYPE 2 Last A1C:   Lab Results  Component Value Date   HGBA1C 10.2 (A) 02/29/2020   Dx 5 yrs ago Med Adherence:  _0  Yes Metformin 1 gram BID and Glucotrol 5 mg daily    Medication side effects:  _1  Yes    _2  No Home Monitoring?  _3  Yes    _4  No Home glucose results range Diet Adherence: _5  Yes    _6  No - Drinks 3 cans of [employer] a day.  Snacks on chips.  Eat a lot of bread.  Exercise: "Not as I should."  Takes care of mom who lives with her.  Worry when ever she has to leave the house Hypoglycemic episodes?: _7  Yes    _8  No Numbness of the feet? _9  Yes  -tingling in feet. Some burning on bottom of feet.  Retinopathy hx? _10  Yes    _11  No Last eye exam: "I can't see."  Over due for eye exam does not have insurance Comments:  Endorses polyuria and polydipsia, + dry mouth.    HYPERTENSION Currently taking: see medication list Med Adherence: _12  Yes - on HCTZ and Lisinopril Medication side effects: _13  Yes    _14  No Adherence with salt restriction: _15  Yes    _16  No Home Monitoring?: _17  Yes    _18  No Monitoring Frequency: _19  Yes    _20  No Home BP results range: _21  Yes    _22  No SOB? _23  Yes    _24  No but does get out of breath if she does too much Chest Pain?: _25  Yes    _26  No Leg swelling?: _27  Yes    _28  No Headaches?: _29  Yes    _30  No Dizziness? _31  Yes    _32  No Comments:   HL:  Taking and tolerating Pravachol  HIV:  Has appt 03/08/2020. On Biktarvy.  Last quant viral load was undetected in January of this year.  FU:XNAT MMG 01/03/2020.  Never had c-scope. No fhx of colon CA. Had hysterectomy >20 yrs ago for fibroids.     Current Outpatient  Medications on File Prior to Visit  Medication Sig Dispense Refill  . bictegravir-emtricitabine-tenofovir AF (BIKTARVY) 50-200-25 MG TABS tablet Take 1 tablet by mouth daily.    . cholecalciferol (VITAMIN D) 1000 UNITS tablet Take 1,000 Units by mouth daily.    Marland Kitchen efavirenz-emtricitabine-tenofovir (ATRIPLA) 600-200-300 MG tablet Take 1 tablet by mouth at bedtime.    . hydrochlorothiazide (HYDRODIURIL) 25 MG tablet Take 25 mg by mouth daily.    Marland Kitchen lisinopril (PRINIVIL,ZESTRIL) 5 MG tablet Take 5 mg by mouth daily.    . metFORMIN (GLUCOPHAGE) 500 MG tablet Take 1,000 mg by mouth 2 (two) times daily with a meal.     . Multiple Vitamin (MULTIVITAMIN WITH MINERALS) TABS tablet Take 1 tablet by mouth daily.    . pravastatin (PRAVACHOL) 10 MG tablet Take 10 mg by mouth daily.    . traMADol (ULTRAM) 50 MG tablet Take 1 tablet (50 mg total) by mouth every 6 (six) hours as needed. (Patient not taking: Reported on 02/29/2020) 10 tablet 0   No current  facility-administered medications on file prior to visit.    No Known Allergies  Social History   Socioeconomic History  . Marital status: Single    Spouse name: Not on file  . Number of children: 2  . Years of education: Not on file  . Highest education level: Not on file  Occupational History  . Not on file  Tobacco Use  . Smoking status: Never Smoker  . Smokeless tobacco: Never Used  Vaping Use  . Vaping Use: Never used  Substance and Sexual Activity  . Alcohol use: Yes    Comment: occasionally  . Drug use: No  . Sexual activity: Not on file  Other Topics Concern  . Not on file  Social History Narrative  . Not on file   Social Determinants of Health   Financial Resource Strain:   . Difficulty of Paying Living Expenses:   Food Insecurity:   . Worried About Charity fundraiser in the Last Year:   . Arboriculturist in the Last Year:   Transportation Needs:   . Film/video editor (Medical):   Marland Kitchen Lack of Transportation  (Non-Medical):   Physical Activity:   . Days of Exercise per Week:   . Minutes of Exercise per Session:   Stress:   . Feeling of Stress :   Social Connections:   . Frequency of Communication with Friends and Family:   . Frequency of Social Gatherings with Friends and Family:   . Attends Religious Services:   . Active Member of Clubs or Organizations:   . Attends Archivist Meetings:   Marland Kitchen Marital Status:   Intimate Partner Violence:   . Fear of Current or Ex-Partner:   . Emotionally Abused:   Marland Kitchen Physically Abused:   . Sexually Abused:     Family History  Problem Relation Age of Onset  . Diabetes Mother   . Hypertension Mother   . Hypertension Sister   . Diabetes Maternal Grandmother   . Diabetes Maternal Grandfather     Past Surgical History:  Procedure Laterality Date  . ABDOMINAL HYSTERECTOMY    . BREAST REDUCTION SURGERY Bilateral     ROS: Review of Systems Negative except as stated above  PHYSICAL EXAM: BP 127/76   Pulse 84   Resp 16   Ht _0  (1.575 m)   Wt 209 lb 3.2 oz (94.9 kg)   SpO2 97%   BMI 38.26 kg/m   Physical Exam  General appearance - alert, well appearing, middle-age obese African-American female and in no distress Mental status - normal mood, behavior, speech, dress, motor activity, and thought processes Eyes - pupils equal and reactive, extraocular eye movements intact Mouth - mucous membranes moist, pharynx normal without lesions Neck - supple, no significant adenopathy Chest - clear to auscultation, no wheezes, rales or rhonchi, symmetric air entry Heart - normal rate, regular rhythm, normal S1, S2, no murmurs, rubs, clicks or gallops Extremities - peripheral pulses normal, no pedal edema, no clubbing or cyanosis   Diabetic Foot Exam - Simple   Simple Foot Form Visual Inspection No deformities, no ulcerations, no other skin breakdown bilaterally: Yes Sensation Testing Intact to touch and monofilament testing bilaterally:  Yes Pulse Check Posterior Tibialis and Dorsalis pulse intact bilaterally: Yes Comments     Depression screen PHQ 2/9 02/29/2020  Decreased Interest 2  Down, Depressed, Hopeless 0  PHQ - 2 Score 2  Altered sleeping 0  Tired, decreased energy 3  Change in  appetite 3  Feeling bad or failure about yourself  0  Trouble concentrating 2  Moving slowly or fidgety/restless 0  Suicidal thoughts 0  PHQ-9 Score 10     ASSESSMENT AND PLAN: 1. Type 2 diabetes mellitus with diabetic polyneuropathy, without long-term current use of insulin (HCC) Discussed the importance of healthy eating habits, regular aerobic exercise (at least 150 minutes a week as tolerated) and medication compliance to achieve or maintain control of diabetes. Stop glipizide.  Continue Metformin.  Add Lantus insulin instead.  Went over blood sugar goals for before and after meals.  Advised patient to check blood sugars at least twice a day before meals and bring in readings on next visit with the clinical pharmacist.  Martin Majestic over signs and symptoms of hypoglycemia and how to treat. -Clinical pharmacist did insulin teaching today. - POCT glucose (manual entry) - POCT glycosylated hemoglobin (Hb A1C) - Microalbumin / creatinine urine ratio - glucose blood (TRUE METRIX BLOOD GLUCOSE TEST) test strip; Use as instructed  Dispense: 100 each; Refill: 12 - Blood Glucose Monitoring Suppl (TRUE METRIX METER) w/Device KIT; Use as directed  Dispense: 1 kit; Refill: 0 - TRUEplus Lancets 28G MISC; Use as directed  Dispense: 100 each; Refill: 4 - insulin glargine (LANTUS SOLOSTAR) 100 UNIT/ML Solostar Pen; Inject 12 Units into the skin at bedtime.  Dispense: 5 pen; Refill: PRN - Insulin Pen Needle (PEN NEEDLES) 31G X 8 MM MISC; UAD  Dispense: 100 each; Refill: 6 - CBC - Comprehensive metabolic panel - Lipid panel  2. Essential hypertension At goal.  Continue current medications and low-salt diet  3. Hyperlipidemia associated with type 2  diabetes mellitus (HCC) Continue Pravachol.  4. Asymptomatic HIV infection ([place]) Continue follow-up with ID through [place] as prescribed by her provider there  5. Positive depression screening We did not get to discuss this today but will follow up on next visit  6. Microalbuminuria Stressed the importance of better diabetes control.  Continue lisinopril  7. Colon cancer screening Discuss colon cancer screening methods.  She is agreeable to having fit test.  She has never had a colonoscopy and is uninsured at this time. - Fecal occult blood, imunochemical([facility])  8. Class 2 severe obesity due to excess calories with serious comorbidity and body mass index (BMI) of 38.0 to 38.9 in adult [facility]) See #1 above.    Patient was given the opportunity to ask questions.  Patient verbalized understanding of the plan and was able to repeat key elements of the plan.   Orders Placed This Encounter  Procedures  . Fecal occult blood, imunochemical([facility])  . Microalbumin / creatinine urine ratio  . CBC  . Comprehensive metabolic panel  . Lipid panel  . POCT glucose (manual entry)  . POCT glycosylated hemoglobin (Hb A1C)     Requested Prescriptions   Signed Prescriptions Disp Refills  . glucose blood (TRUE METRIX BLOOD GLUCOSE TEST) test strip 100 each 12    Sig: Use as instructed  . Blood Glucose Monitoring Suppl (TRUE METRIX METER) w/Device KIT 1 kit 0    Sig: Use as directed  . TRUEplus Lancets 28G MISC 100 each 4    Sig: Use as directed  . insulin glargine (LANTUS SOLOSTAR) 100 UNIT/ML Solostar Pen 5 pen PRN    Sig: Inject 12 Units into the skin at bedtime.  . Insulin Pen Needle (PEN NEEDLES) 31G X 8 MM MISC 100 each 6    Sig: UAD  Return in about 3 months (around 05/31/2020) for [facility] in 1 mth for DM recheck.  Karle Plumber, MD, FACP

## 2020-03-01 ENCOUNTER — Other Ambulatory Visit: Payer: Self-pay | Admitting: Internal Medicine

## 2020-03-01 LAB — MICROALBUMIN / CREATININE URINE RATIO
Creatinine, Urine: 51.3 mg/dL
Microalb/Creat Ratio: <6 mg/g creat (ref 0–29)
Microalbumin, Urine: <3 ug/mL

## 2020-03-01 LAB — LIPID PANEL
Chol/HDL Ratio: 6.6 ratio — ABNORMAL HIGH (ref 0.0–4.4)
Cholesterol, Total: 264 mg/dL — ABNORMAL HIGH (ref 100–199)
HDL: 40 mg/dL (ref >39)
LDL Chol Calc (NIH): 179 mg/dL — ABNORMAL HIGH (ref 0–99)
Triglycerides: 238 mg/dL — ABNORMAL HIGH (ref 0–149)
VLDL Cholesterol Cal: 45 mg/dL — ABNORMAL HIGH (ref 5–40)

## 2020-03-01 LAB — COMPREHENSIVE METABOLIC PANEL
ALT: 13 IU/L (ref 0–32)
AST: 13 IU/L (ref 0–40)
Albumin/Globulin Ratio: 1.6 (ref 1.2–2.2)
Albumin: 4.2 g/dL (ref 3.8–4.9)
Alkaline Phosphatase: 93 IU/L (ref 48–121)
BUN/Creatinine Ratio: 19 (ref 9–23)
BUN: 13 mg/dL (ref 6–24)
Bilirubin Total: <0.2 mg/dL (ref 0.0–1.2)
CO2: 27 mmol/L (ref 20–29)
Calcium: 9.5 mg/dL (ref 8.7–10.2)
Chloride: 96 mmol/L (ref 96–106)
Creatinine, Ser: 0.69 mg/dL (ref 0.57–1.00)
GFR calc Af Amer: 111 mL/min/{1.73_m2} (ref >59)
GFR calc non Af Amer: 96 mL/min/{1.73_m2} (ref >59)
Globulin, Total: 2.6 g/dL (ref 1.5–4.5)
Glucose: 305 mg/dL — ABNORMAL HIGH (ref 65–99)
Potassium: 3.9 mmol/L (ref 3.5–5.2)
Sodium: 135 mmol/L (ref 134–144)
Total Protein: 6.8 g/dL (ref 6.0–8.5)

## 2020-03-01 LAB — CBC
Hematocrit: 43.1 % (ref 34.0–46.6)
Hemoglobin: 14 g/dL (ref 11.1–15.9)
MCH: 29.6 pg (ref 26.6–33.0)
MCHC: 32.5 g/dL (ref 31.5–35.7)
MCV: 91 fL (ref 79–97)
Platelets: 323 10*3/uL (ref 150–450)
RBC: 4.73 x10E6/uL (ref 3.77–5.28)
RDW: 12.8 % (ref 11.7–15.4)
WBC: 8.3 10*3/uL (ref 3.4–10.8)

## 2020-03-01 MED ORDER — PRAVASTATIN SODIUM 40 MG PO TABS: 40.0000 mg | ORAL_TABLET | Freq: Every day | ORAL | 6 refills | Status: DC

## 2020-03-01 NOTE — Progress Notes (Signed)
Let patient know that her blood cell count is normal meaning no anemia.  Kidney and liver function tests are good.  Cholesterol level is 179 with goal being less than 70.  I would like for her to increase the pravastatin from 10 mg daily to 40 mg daily.  Prescription has been sent to her pharmacy.

## 2020-03-04 ENCOUNTER — Telehealth: Payer: Self-pay

## 2020-03-04 NOTE — Telephone Encounter (Signed)
Contacted pt to go over lab results pt didn't answer lvm asking pt to give a call back at her earliest convenience  

## 2020-03-20 MED FILL — !LANTUS SOLOSTAR 100UNITS/M: 100 | 25 days supply | Qty: 3 | Fill #1

## 2020-04-11 MED FILL — TRUEplus LANCETS 28G MISC: 25 days supply | Qty: 100 | Fill #1

## 2020-04-12 MED FILL — TRUEPLUS PEN NDL 31GX5/16: 31G X 8 MM | 25 days supply | Qty: 100 | Fill #1

## 2020-04-19 MED FILL — !LANTUS SOLOSTAR 100UNITS/M: 100 | 25 days supply | Qty: 3 | Fill #2

## 2020-05-04 ENCOUNTER — Telehealth: Payer: Self-pay

## 2020-05-04 LAB — FECAL OCCULT BLOOD, IMMUNOCHEMICAL: Fecal Occult Bld: NEGATIVE

## 2020-05-04 NOTE — Telephone Encounter (Signed)
Contacted pt to go over fit test pt didn't answer lvm asking pt to give a call back

## 2020-05-24 MED FILL — !LANTUS SOLOSTAR 100UNITS/M: 100 | 25 days supply | Qty: 3 | Fill #3

## 2020-05-31 ENCOUNTER — Ambulatory Visit: Payer: Self-pay | Attending: Internal Medicine | Admitting: Internal Medicine

## 2020-05-31 ENCOUNTER — Other Ambulatory Visit: Payer: Self-pay

## 2020-05-31 DIAGNOSIS — I10 Essential (primary) hypertension: Secondary | ICD-10-CM

## 2020-05-31 DIAGNOSIS — Z6838 Body mass index (BMI) 38.0-38.9, adult: Secondary | ICD-10-CM

## 2020-05-31 DIAGNOSIS — E1142 Type 2 diabetes mellitus with diabetic polyneuropathy: Secondary | ICD-10-CM

## 2020-05-31 DIAGNOSIS — E1169 Type 2 diabetes mellitus with other specified complication: Secondary | ICD-10-CM

## 2020-05-31 DIAGNOSIS — E785 Hyperlipidemia, unspecified: Secondary | ICD-10-CM

## 2020-05-31 NOTE — Progress Notes (Signed)
Pt states her blood sugar was 248. Pt states she at this morning around 330am

## 2020-05-31 NOTE — Progress Notes (Signed)
Virtual Visit via Telephone Note Due to current restrictions/limitations of in-office visits due to the COVID-19 pandemic, this scheduled clinical appointment was converted to a telehealth visit  I connected with Jennifer Ford on 05/31/20 at 2:54 p.m by telephone and verified that I am speaking with the correct person using two identifiers. I am in my office.  The patient is at work.  Only the patient, CMA Sallyanne Havers and myself participated in this encounter.  I discussed the limitations, risks, security and privacy concerns of performing an evaluation and management service by telephone and the availability of in person appointments. I also discussed with the patient that there may be a patient responsible charge related to this service. The patient expressed understanding and agreed to proceed.   History of Present Illness: Pt with hx of HTN, DM with micoralbumin/polyneuropathy, obesity, HL, HIV.Last seen 7/21.  Today is for chronic disease management.  HL:  LDL was not at goal on last visit based on blood test.  I recommend increasing the pravastatin from 10 mg daily to 40 mg daily.  She is taking the increased dose and tolerating it well.  Denies any muscle aches or cramps.  DIABETES TYPE 2 Last A1C:   Lab Results  Component Value Date   HGBA1C 10.2 (A) 02/29/2020  Med Adherence:  [x]  Yes - Medication side effects:  []  Yes    [x]  No Home Monitoring?  [x]  Yes  Before BF and bedtimes Home glucose results range:  Before [id number]; bedtime 170-180.  Highest was  248 this a.m.  She attributes this to eating early morning after getting off work Diet Adherence: doing much better.  Almost given up on sodas and cut back on bread.  Eating more fruits, not buying chips anymore  Exercise: [x]  Yes - does a lot of walking at work  Hypoglycemic episodes?: []  Yes    [x]  No Numbness of the feet? [x] not tingling as much as before Retinopathy hx? []  Yes    []  No Last eye exam:  Did not get eye exam as yet.   Plans to get done at [facility] Comments:   HTN:  Limiting salt and compliant with med lisinopril.  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  I was able to view her record on [facility] care everywhere site and update her immunization record in our system.   Outpatient Encounter Medications as of 05/31/2020  Medication Sig  . bictegravir-emtricitabine-tenofovir AF (BIKTARVY) 50-200-25 MG TABS tablet Take 1 tablet by mouth daily.  . Blood Glucose Monitoring Suppl (TRUE METRIX METER) w/Device KIT Use as directed  . cholecalciferol (VITAMIN D) 1000 UNITS tablet Take 1,000 Units by mouth daily.  Marland Kitchen efavirenz-emtricitabine-tenofovir (ATRIPLA) 600-200-300 MG tablet Take 1 tablet by mouth at bedtime.  Marland Kitchen glucose blood (TRUE METRIX BLOOD GLUCOSE TEST) test strip Use as instructed  . hydrochlorothiazide (HYDRODIURIL) 25 MG tablet Take 25 mg by mouth daily.  . insulin glargine (LANTUS SOLOSTAR) 100 UNIT/ML Solostar Pen Inject 12 Units into the skin at bedtime.  . Insulin Pen Needle (PEN NEEDLES) 31G X 8 MM MISC UAD  . lisinopril (PRINIVIL,ZESTRIL) 5 MG tablet Take 5 mg by mouth daily.  . metFORMIN (GLUCOPHAGE) 500 MG tablet Take 1,000 mg by mouth 2 (two) times daily with a meal.   . Multiple Vitamin (MULTIVITAMIN WITH MINERALS) TABS tablet Take 1 tablet by mouth daily.  . pravastatin (PRAVACHOL) 40 MG tablet Take 1 tablet (40  mg total) by mouth daily.  . traMADol (ULTRAM) 50 MG tablet Take 1 tablet (50 mg total) by mouth every 6 (six) hours as needed. (Patient not taking: Reported on 02/29/2020)  . TRUEplus Lancets 28G MISC Use as directed   No facility-administered encounter medications on file as of 05/31/2020.      Observations/Objective: Results for orders placed or performed in visit on 02/29/20  Fecal occult blood, imunochemical([facility])   Specimen: Stool   ST  Result Value Ref Range   Fecal Occult Bld Negative Negative  Microalbumin /  creatinine urine ratio  Result Value Ref Range   Creatinine, Urine 51.3 Not Estab. mg/dL   Microalbumin, Urine <3.0 Not Estab. ug/mL   Microalb/Creat Ratio <6 0 - 29 mg/g creat  CBC  Result Value Ref Range   WBC 8.3 3.4 - 10.8 x10E3/uL   RBC 4.73 3.77 - 5.28 x10E6/uL   Hemoglobin 14.0 11.1 - 15.9 g/dL   Hematocrit 43.1 34.0 - 46.6 %   MCV 91 79 - 97 fL   MCH 29.6 26.6 - 33.0 pg   MCHC 32.5 31 - 35 g/dL   RDW 12.8 11.7 - 15.4 %   Platelets 323 150 - 450 x10E3/uL  Comprehensive metabolic panel  Result Value Ref Range   Glucose 305 (H) 65 - 99 mg/dL   BUN 13 6 - 24 mg/dL   Creatinine, Ser 0.69 0.57 - 1.00 mg/dL   GFR calc non Af Amer 96 >59 mL/min/1.73   GFR calc Af Amer 111 >59 mL/min/1.73   BUN/Creatinine Ratio 19 9 - 23   Sodium 135 134 - 144 mmol/L   Potassium 3.9 3.5 - 5.2 mmol/L   Chloride 96 96 - 106 mmol/L   CO2 27 20 - 29 mmol/L   Calcium 9.5 8.7 - 10.2 mg/dL   Total Protein 6.8 6.0 - 8.5 g/dL   Albumin 4.2 3.8 - 4.9 g/dL   Globulin, Total 2.6 1.5 - 4.5 g/dL   Albumin/Globulin Ratio 1.6 1.2 - 2.2   Bilirubin Total <0.2 0.0 - 1.2 mg/dL   Alkaline Phosphatase 93 48 - 121 IU/L   AST 13 0 - 40 IU/L   ALT 13 0 - 32 IU/L  Lipid panel  Result Value Ref Range   Cholesterol, Total 264 (H) 100 - 199 mg/dL   Triglycerides 238 (H) 0 - 149 mg/dL   HDL 40 >39 mg/dL   VLDL Cholesterol Cal 45 (H) 5 - 40 mg/dL   LDL Chol Calc (NIH) 179 (H) 0 - 99 mg/dL   Chol/HDL Ratio 6.6 (H) 0.0 - 4.4 ratio  POCT glucose (manual entry)  Result Value Ref Range   POC Glucose 345 (A) 70 - 99 mg/dl  POCT glycosylated hemoglobin (Hb A1C)  Result Value Ref Range   Hemoglobin A1C     HbA1c POC (<> result, manual entry)     HbA1c, POC (prediabetic range)     HbA1c, POC (controlled diabetic range) 10.2 (A) 0.0 - 7.0 %    Assessment and Plan: 1. Type 2 diabetes mellitus with peripheral neuropathy (HCC) Reported blood sugar readings are at goal. Commended her on changes that she is made in her  eating habits so far.  Encouraged her to keep up the good work.  Encouraged her to move as much as she can. Continue Metformin and current dose of Lantus. - Hemoglobin A1c; Future  2. Essential hypertension Continue HCTZ and lisinopril.  Advised to check blood pressure at least twice a week with  goal being 130/80 or lower.  3. Hyperlipidemia associated with type 2 diabetes mellitus (HCC) Continue pravastatin 40 mg.  We will check lipid profile to see whether she is at goal with the higher dose. - Lipid panel; Future  4. Class 2 severe obesity due to excess calories with serious comorbidity and body mass index (BMI) of 38.0 to 38.9 in adult [facility]) See #1 above.  Advised patient to let me know once she has received the flu shot so that I can update her health maintenance.  Follow Up Instructions: 4 mths   I discussed the assessment and treatment plan with the patient. The patient was provided an opportunity to ask questions and all were answered. The patient agreed with the plan and demonstrated an understanding of the instructions.   The patient was advised to call back or seek an in-person evaluation if the symptoms worsen or if the condition fails to improve as anticipated.  I provided 14 minutes of non-face-to-face time during this encounter.   Karle Plumber, MD

## 2020-06-03 ENCOUNTER — Ambulatory Visit: Payer: Self-pay | Attending: Internal Medicine

## 2020-06-03 ENCOUNTER — Other Ambulatory Visit: Payer: Self-pay

## 2020-06-03 DIAGNOSIS — E1169 Type 2 diabetes mellitus with other specified complication: Secondary | ICD-10-CM

## 2020-06-03 DIAGNOSIS — E1142 Type 2 diabetes mellitus with diabetic polyneuropathy: Secondary | ICD-10-CM

## 2020-06-04 ENCOUNTER — Other Ambulatory Visit: Payer: Self-pay | Admitting: Internal Medicine

## 2020-06-04 DIAGNOSIS — E1142 Type 2 diabetes mellitus with diabetic polyneuropathy: Secondary | ICD-10-CM

## 2020-06-04 LAB — HEMOGLOBIN A1C
Est. average glucose Bld gHb Est-mCnc: 217 mg/dL
Hgb A1c MFr Bld: 9.2 % — ABNORMAL HIGH (ref 4.8–5.6)

## 2020-06-04 LAB — LIPID PANEL
Chol/HDL Ratio: 5.1 ratio — ABNORMAL HIGH (ref 0.0–4.4)
Cholesterol, Total: 159 mg/dL (ref 100–199)
HDL: 31 mg/dL — ABNORMAL LOW (ref >39)
LDL Chol Calc (NIH): 105 mg/dL — ABNORMAL HIGH (ref 0–99)
Triglycerides: 124 mg/dL (ref 0–149)
VLDL Cholesterol Cal: 23 mg/dL (ref 5–40)

## 2020-06-04 MED ORDER — LANTUS SOLOSTAR 100 UNIT/ML ~~LOC~~ SOPN
15.0000 [IU] | PEN_INJECTOR | Freq: Every day | SUBCUTANEOUS | 2 refills | Status: DC
  Filled 2020-11-27: qty 12, 80d supply, fill #0
  Filled 2020-11-27: qty 15, 100d supply, fill #0
  Filled 2021-02-25: qty 12, 80d supply, fill #1

## 2020-06-04 NOTE — Progress Notes (Signed)
LDL cholesterol improved from 179 to 105 with goal being less than 70.  I recommend changing the Pravachol to Lipitor 20 mg for better lipid lowering effect.  If no insurance , it would be cheaper for her to get this med through our pharmacy at [facility].  Rxn sent.  A1C is 9.2 with goal being less than 7. This means her diabetes is not as control as it should be. Recommend increase Lantus insulin from 12 units daily to 15 units daily.  Continue to monitor BS.

## 2020-06-10 MED FILL — ?BASAGLAR 100 UNITS/ML KWPE: 100 | 20 days supply | Qty: 3 | Fill #0

## 2020-06-19 MED FILL — ?BASAGLAR 100 UNITS/ML KWPE: 100 | 25 days supply | Qty: 3 | Fill #4

## 2020-07-18 MED FILL — ?BASAGLAR 100 UNITS/ML KWPE: 100 | 25 days supply | Qty: 3 | Fill #5

## 2020-08-14 ENCOUNTER — Telehealth: Payer: Self-pay | Admitting: Internal Medicine

## 2020-08-14 MED FILL — ?BASAGLAR 100 UNITS/ML KWPE: 100 | 25 days supply | Qty: 3 | Fill #6

## 2020-08-27 ENCOUNTER — Other Ambulatory Visit: Payer: Self-pay | Admitting: Internal Medicine

## 2020-09-10 MED FILL — ?BASAGLAR 100 UNITS/ML KWPE: 100 | 25 days supply | Qty: 3 | Fill #7

## 2020-09-10 MED FILL — TRUE METRIX GLUCOSE TEST ST: 25 days supply | Qty: 100 | Fill #1

## 2020-09-30 MED FILL — ?BASAGLAR 100 UNITS/ML KWPE: 100 | 25 days supply | Qty: 3 | Fill #8

## 2020-11-27 ENCOUNTER — Other Ambulatory Visit: Payer: Self-pay

## 2020-11-28 ENCOUNTER — Encounter: Payer: Self-pay | Admitting: Internal Medicine

## 2020-11-28 ENCOUNTER — Other Ambulatory Visit: Payer: Self-pay

## 2020-11-28 ENCOUNTER — Ambulatory Visit: Payer: Self-pay | Attending: Internal Medicine | Admitting: Internal Medicine

## 2020-11-28 DIAGNOSIS — E785 Hyperlipidemia, unspecified: Secondary | ICD-10-CM

## 2020-11-28 DIAGNOSIS — E1169 Type 2 diabetes mellitus with other specified complication: Secondary | ICD-10-CM

## 2020-11-28 DIAGNOSIS — Z21 Asymptomatic human immunodeficiency virus [HIV] infection status: Secondary | ICD-10-CM

## 2020-11-28 DIAGNOSIS — E1142 Type 2 diabetes mellitus with diabetic polyneuropathy: Secondary | ICD-10-CM

## 2020-11-28 DIAGNOSIS — E1159 Type 2 diabetes mellitus with other circulatory complications: Secondary | ICD-10-CM

## 2020-11-28 DIAGNOSIS — I152 Hypertension secondary to endocrine disorders: Secondary | ICD-10-CM

## 2020-11-28 LAB — GLUCOSE, POCT (MANUAL RESULT ENTRY): POC Glucose: 149 mg/dl — AB (ref 70–99)

## 2020-11-28 NOTE — Progress Notes (Signed)
Patient ID: Jennifer Ford, female    DOB: 1961/02/01  MRN: [id number]  CC: Diabetes   Subjective: Jennifer Ford is a 60 y.o. female who presents for chronic ds management Her concerns today include:  Pt with hx of HTN, DM with micoralbumin/polyneuropathy, obesity, HL, HIV  DIABETES TYPE 2/Obesity Last A1C:   Results for orders placed or performed in visit on 11/28/20  POCT glucose (manual entry)  Result Value Ref Range   POC Glucose 149 (A) 70 - 99 mg/dl    Med Adherence:  [x]  Yes -Lantus insulin 15, Metformin 500 mg 2 tablets at bedtime.  She is supposed to be on Metformin 1000 mg twice a day. Medication side effects:  [x]  Yes    []  No Home Monitoring?  [x]  Yes -every other day Home glucose results range: 111-135.  Highest 174. 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  Pt cooks for them but mom only eats a [place].  Pt feels compell to eat more. Started drinking 1-2 [place] a day.  Eating a lot of white bread Exercise: [x]  Yes very active and does a lot of walkimg Hypoglycemic episodes?: []  Yes    [x]  No Numbness of the feet? []  Yes    [x]  No but tingling in toes intermittently.  Retinopathy hx? []  Yes    []  No Last eye exam: went to [facility] Comments:   HYPERTENSION Currently taking: see medication list.  On HCTZ and lisinopril. Med Adherence: [x]  Yes    []  No Medication side effects: []  Yes    [x]  No Adherence with salt restriction: [x]  Yes    []  No Home Monitoring?: []  Yes    [x]  No Monitoring Frequency: []  Yes    []  No Home BP results range: []  Yes    []  No SOB? []  Yes    [x]  No Chest Pain?: []  Yes    [x]  No Leg swelling?: []  Yes    [x]  No Headaches?: []  Yes    [x]  No Dizziness? []  Yes    [x]  No Comments:   HL:  Tolerating statin  HIV: Reports compliance with her medication Biktarvy.  Most recent viral load was undetectable in January of this year.  She has not had any sore throat, fever, cough or rash.  She is compliant with follow-up with her ID  specialist.    Patient Active Problem List   Diagnosis Date Noted  . Class 2 severe obesity due to excess calories with serious comorbidity and body mass index (BMI) of 38.0 to 38.9 in adult ([place]) 02/29/2020  . Microalbuminuria 02/29/2020  . Positive depression screening 02/29/2020  . Hyperlipidemia associated with type 2 diabetes mellitus ([place]) 02/29/2020  . Essential hypertension 02/29/2020  . Type 2 diabetes mellitus with diabetic polyneuropathy, without long-term current use of insulin ([place]) 02/29/2020  . Asymptomatic HIV infection ([place]) 02/29/2020     Current Outpatient Medications on File Prior to Visit  Medication Sig Dispense Refill  . bictegravir-emtricitabine-tenofovir AF (BIKTARVY) 50-200-25 MG TABS tablet Take 1 tablet by mouth daily.    . Blood Glucose Monitoring Suppl (TRUE METRIX METER) w/Device KIT Use as directed 1 kit 0  . cholecalciferol (VITAMIN D) 1000 UNITS tablet Take 1,000 Units by mouth daily.    Marland Kitchen efavirenz-emtricitabine-tenofovir (ATRIPLA) 600-200-300 MG tablet Take 1 tablet by mouth at bedtime.    Marland Kitchen glucose blood test strip USE AS INSTRUCTED 100 strip 12  . hydrochlorothiazide (HYDRODIURIL) 25 MG tablet Take 25 mg by mouth daily.    Marland Kitchen  insulin glargine (LANTUS SOLOSTAR) 100 UNIT/ML Solostar Pen Inject 15 Units into the skin at bedtime. 15 mL 2  . Insulin Pen Needle (PEN NEEDLES) 31G X 8 MM MISC UAD 100 each 6  . Insulin Pen Needle 31G X 8 MM MISC USE AS DIRECTED 100 each 6  . lisinopril (PRINIVIL,ZESTRIL) 5 MG tablet Take 5 mg by mouth daily.    . metFORMIN (GLUCOPHAGE) 500 MG tablet Take 1,000 mg by mouth 2 (two) times daily with a meal.     . Multiple Vitamin (MULTIVITAMIN WITH MINERALS) TABS tablet Take 1 tablet by mouth daily.    . pravastatin (PRAVACHOL) 40 MG tablet TAKE 1 TABLET(40 MG) BY MOUTH DAILY 30 tablet 2  . traMADol (ULTRAM) 50 MG tablet Take 1 tablet (50 mg total) by mouth every 6 (six) hours as needed. (Patient not taking: Reported on  02/29/2020) 10 tablet 0  . TRUEplus Lancets 28G MISC Use as directed 100 each 4   No current facility-administered medications on file prior to visit.    No Known Allergies  Social History   Socioeconomic History  . Marital status: Single    Spouse name: Not on file  . Number of children: 2  . Years of education: Not on file  . Highest education level: Not on file  Occupational History  . Not on file  Tobacco Use  . Smoking status: Never Smoker  . Smokeless tobacco: Never Used  Vaping Use  . Vaping Use: Never used  Substance and Sexual Activity  . Alcohol use: Yes    Comment: occasionally  . Drug use: No  . Sexual activity: Not on file  Other Topics Concern  . Not on file  Social History Narrative  . Not on file   Social Determinants of Health   Financial Resource Strain: Not on file  Food Insecurity: Not on file  Transportation Needs: Not on file  Physical Activity: Not on file  Stress: Not on file  Social Connections: Not on file  Intimate Partner Violence: Not on file    Family History  Problem Relation Age of Onset  . Diabetes Mother   . Hypertension Mother   . Hypertension Sister   . Diabetes Maternal Grandmother   . Diabetes Maternal Grandfather     Past Surgical History:  Procedure Laterality Date  . ABDOMINAL HYSTERECTOMY    . BREAST REDUCTION SURGERY Bilateral     ROS: Review of Systems Negative except as stated above  PHYSICAL EXAM: BP 136/75   Pulse 73   Resp 16   Wt 205 lb 3.2 oz (93.1 kg)   SpO2 97%   BMI 37.53 kg/m   Wt Readings from Last 3 Encounters:  11/28/20 205 lb 3.2 oz (93.1 kg)  02/29/20 209 lb 3.2 oz (94.9 kg)  12/13/11 198 lb (89.8 kg)    Physical Exam  General appearance - alert, well appearing, and in no distress Mental status - normal mood, behavior, speech, dress, motor activity, and thought processes Nose - normal and patent, no erythema, discharge or polyps Mouth - mucous membranes moist, pharynx normal  without lesions Chest - clear to auscultation, no wheezes, rales or rhonchi, symmetric air entry Heart - normal rate, regular rhythm, normal S1, S2, no murmurs, rubs, clicks or gallops Extremities - peripheral pulses normal, no pedal edema, no clubbing or cyanosis Diabetic Foot Exam - Simple   Simple Foot Form Visual Inspection See comments: Yes Sensation Testing Intact to touch and monofilament testing bilaterally: Yes Pulse  Check Posterior Tibialis and Dorsalis pulse intact bilaterally: Yes Comments Mildly flat-footed.  Mild hypopigmented areas around both heels.      Depression screen [facility] 2/9 11/28/2020 02/29/2020  Decreased Interest 0 2  Down, Depressed, Hopeless - 0  PHQ - 2 Score 0 2  Altered sleeping - 0  Tired, decreased energy - 3  Change in appetite - 3  Feeling bad or failure about yourself  - 0  Trouble concentrating - 2  Moving slowly or fidgety/restless - 0  Suicidal thoughts - 0  PHQ-9 Score - 10    CMP Latest Ref Rng & Units 02/29/2020  Glucose 65 - 99 mg/dL 305(H)  BUN 6 - 24 mg/dL 13  Creatinine 0.57 - 1.00 mg/dL 0.69  Sodium 134 - 144 mmol/L 135  Potassium 3.5 - 5.2 mmol/L 3.9  Chloride 96 - 106 mmol/L 96  CO2 20 - 29 mmol/L 27  Calcium 8.7 - 10.2 mg/dL 9.5  Total Protein 6.0 - 8.5 g/dL 6.8  Total Bilirubin 0.0 - 1.2 mg/dL <0.2  Alkaline Phos 48 - 121 IU/L 93  AST 0 - 40 IU/L 13  ALT 0 - 32 IU/L 13   Lipid Panel     Component Value Date/Time   CHOL 159 06/03/2020 1025   TRIG 124 06/03/2020 1025   HDL 31 (L) 06/03/2020 1025   CHOLHDL 5.1 (H) 06/03/2020 1025   LDLCALC 105 (H) 06/03/2020 1025    CBC    Component Value Date/Time   WBC 8.3 02/29/2020 1525   RBC 4.73 02/29/2020 1525   HGB 14.0 02/29/2020 1525   HCT 43.1 02/29/2020 1525   PLT 323 02/29/2020 1525   MCV 91 02/29/2020 1525   MCH 29.6 02/29/2020 1525   MCHC 32.5 02/29/2020 1525   RDW 12.8 02/29/2020 1525    ASSESSMENT AND PLAN: 1. Type 2 diabetes mellitus with morbid  obesity ([place]) Reported blood sugar readings are good.  She will continue Lantus 15 units and Metformin at 1000 mg once a day in the evenings.  We will check her A1c today.  If she is not at goal I will let her know to go back to taking the Metformin twice a day.  Discussed and encourage healthy eating habits.  Advised to eliminate sugary drinks.  Stay active. - POCT glucose (manual entry) - Hemoglobin A1c  2. Hypertension associated with diabetes ([place]) At goal.  Continue current medications and low-salt diet  3. Hyperlipidemia associated with type 2 diabetes mellitus (HCC) Continue pravastatin.  4. Asymptomatic HIV infection ([place]) Patient asymptomatic and appears to be doing well.  Encouraged her to remain compliant with her medication and future follow-ups with ID.    Patient was given the opportunity to ask questions.  Patient verbalized understanding of the plan and was able to repeat key elements of the plan.   Orders Placed This Encounter  Procedures  . POCT glucose (manual entry)  . POCT glycosylated hemoglobin (Hb A1C)     Requested Prescriptions    No prescriptions requested or ordered in this encounter    No follow-ups on file.  Karle Plumber, MD, FACP

## 2020-11-28 NOTE — Patient Instructions (Signed)
The goal for your blood sugar before meals is 90-130. Please try to get your eye exam done as soon as possible.   Diabetes Mellitus and Nutrition, Adult When you have diabetes, or diabetes mellitus, it is very important to have healthy eating habits because your blood sugar (glucose) levels are greatly affected by what you eat and drink. Eating healthy foods in the right amounts, at about the same times every day, can help you:  Control your blood glucose.  Lower your risk of heart disease.  Improve your blood pressure.  Reach or maintain a healthy weight. What can affect my meal plan? Every person with diabetes is different, and each person has different needs for a meal plan. Your health care provider may recommend that you work with a dietitian to make a meal plan that is best for you. Your meal plan may vary depending on factors such as:  The calories you need.  The medicines you take.  Your weight.  Your blood glucose, blood pressure, and cholesterol levels.  Your activity level.  Other health conditions you have, such as heart or kidney disease. How do carbohydrates affect me? Carbohydrates, also called carbs, affect your blood glucose level more than any other type of food. Eating carbs naturally raises the amount of glucose in your blood. Carb counting is a method for keeping track of how many carbs you eat. Counting carbs is important to keep your blood glucose at a healthy level, especially if you use insulin or take certain oral diabetes medicines. It is important to know how many carbs you can safely have in each meal. This is different for every person. Your dietitian can help you calculate how many carbs you should have at each meal and for each snack. How does alcohol affect me? Alcohol can cause a sudden decrease in blood glucose (hypoglycemia), especially if you use insulin or take certain oral diabetes medicines. Hypoglycemia can be a life-threatening condition.  Symptoms of hypoglycemia, such as sleepiness, dizziness, and confusion, are similar to symptoms of having too much alcohol.  Do not drink alcohol if: ? Your health care provider tells you not to drink. ? You are pregnant, may be pregnant, or are planning to become pregnant.  If you drink alcohol: ? Do not drink on an empty stomach. ? Limit how much you use to:  0-1 drink a day for women.  0-2 drinks a day for men. ? Be aware of how much alcohol is in your drink. In the [place] one drink equals one 12 oz bottle of beer (355 mL), one 5 oz glass of wine (148 mL), or one 1 oz glass of hard liquor (44 mL). ? Keep yourself hydrated with water, diet soda, or unsweetened iced tea.  Keep in mind that regular soda, juice, and other mixers may contain a lot of sugar and must be counted as carbs. What are tips for following this plan? Reading food labels  Start by checking the serving size on the "Nutrition Facts" label of packaged foods and drinks. The amount of calories, carbs, fats, and other nutrients listed on the label is based on one serving of the item. Many items contain more than one serving per package.  Check the total grams (g) of carbs in one serving. You can calculate the number of servings of carbs in one serving by dividing the total carbs by 15. For example, if a food has 30 g of total carbs per serving, it would be equal to  2 servings of carbs.  Check the number of grams (g) of saturated fats and trans fats in one serving. Choose foods that have a low amount or none of these fats.  Check the number of milligrams (mg) of salt (sodium) in one serving. Most people should limit total sodium intake to less than 2,300 mg per day.  Always check the nutrition information of foods labeled as "low-fat" or "nonfat." These foods may be higher in added sugar or refined carbs and should be avoided.  Talk to your dietitian to identify your daily goals for nutrients listed on the  label. Shopping  Avoid buying canned, pre-made, or processed foods. These foods tend to be high in fat, sodium, and added sugar.  Shop around the outside edge of the grocery store. This is where you will most often find fresh fruits and vegetables, bulk grains, fresh meats, and fresh dairy. Cooking  Use low-heat cooking methods, such as baking, instead of high-heat cooking methods like deep frying.  Cook using healthy oils, such as olive, canola, or sunflower oil.  Avoid cooking with butter, cream, or high-fat meats. Meal planning  Eat meals and snacks regularly, preferably at the same times every day. Avoid going long periods of time without eating.  Eat foods that are high in fiber, such as fresh fruits, vegetables, beans, and whole grains. Talk with your dietitian about how many servings of carbs you can eat at each meal.  Eat 4-6 oz (112-168 g) of lean protein each day, such as lean meat, chicken, fish, eggs, or tofu. One ounce (oz) of lean protein is equal to: ? 1 oz (28 g) of meat, chicken, or fish. ? 1 egg. ?  cup (62 g) of tofu.  Eat some foods each day that contain healthy fats, such as avocado, nuts, seeds, and fish.   What foods should I eat? Fruits Berries. Apples. Oranges. Peaches. Apricots. Plums. Grapes. Mango. Papaya. Pomegranate. Kiwi. Cherries. Vegetables Lettuce. Spinach. Leafy greens, including kale, chard, collard greens, and mustard greens. Beets. Cauliflower. Cabbage. Broccoli. Carrots. Green beans. Tomatoes. Peppers. Onions. Cucumbers. Brussels sprouts. Grains Whole grains, such as whole-wheat or whole-grain bread, crackers, tortillas, cereal, and pasta. Unsweetened oatmeal. Quinoa. Brown or wild rice. Meats and other proteins Seafood. Poultry without skin. Lean cuts of poultry and beef. Tofu. Nuts. Seeds. Dairy Low-fat or fat-free dairy products such as milk, yogurt, and cheese. The items listed above may not be a complete list of foods and beverages you  can eat. Contact a dietitian for more information. What foods should I avoid? Fruits Fruits canned with syrup. Vegetables Canned vegetables. Frozen vegetables with butter or cream sauce. Grains Refined white flour and flour products such as bread, pasta, snack foods, and cereals. Avoid all processed foods. Meats and other proteins Fatty cuts of meat. Poultry with skin. Breaded or fried meats. Processed meat. Avoid saturated fats. Dairy Full-fat yogurt, cheese, or milk. Beverages Sweetened drinks, such as soda or iced tea. The items listed above may not be a complete list of foods and beverages you should avoid. Contact a dietitian for more information. Questions to ask a health care provider  Do I need to meet with a diabetes educator?  Do I need to meet with a dietitian?  What number can I call if I have questions?  When are the best times to check my blood glucose? Where to find more information:  American Diabetes Association: diabetes.org  Academy of Nutrition and Dietetics: www.eatright.[employer] of Diabetes  and Digestive and Kidney Diseases: [employer]  Association of Diabetes Care and Education Specialists: www.diabeteseducator.org Summary  It is important to have healthy eating habits because your blood sugar (glucose) levels are greatly affected by what you eat and drink.  A healthy meal plan will help you control your blood glucose and maintain a healthy lifestyle.  Your health care provider may recommend that you work with a dietitian to make a meal plan that is best for you.  Keep in mind that carbohydrates (carbs) and alcohol have immediate effects on your blood glucose levels. It is important to count carbs and to use alcohol carefully. This information is not intended to replace advice given to you by your health care provider. Make sure you discuss any questions you have with your health care provider. Document Revised: 07/11/2019  Document Reviewed: 07/11/2019 Elsevier Patient Education  2021 [employer].

## 2020-11-29 LAB — HEMOGLOBIN A1C
Est. average glucose Bld gHb Est-mCnc: 189 mg/dL
Hgb A1c MFr Bld: 8.2 % — ABNORMAL HIGH (ref 4.8–5.6)

## 2020-11-29 NOTE — Progress Notes (Signed)
Let patient know that her A1c is 8.2.  It is improved compared to 5 months ago but not at goal.  Goal is to be less than 7.  I recommend that she takes the Metformin 500 mg 2 tablets in the morning and 2 tablets in the evening as prescribed to help get her A1c to goal.

## 2020-12-06 ENCOUNTER — Telehealth: Payer: Self-pay

## 2020-12-06 NOTE — Telephone Encounter (Signed)
Contacted pt to go over lab results pt is aware and doesn't have any questions or concerns 

## 2020-12-22 ENCOUNTER — Other Ambulatory Visit: Payer: Self-pay | Admitting: Internal Medicine

## 2020-12-22 NOTE — Telephone Encounter (Signed)
Requested Prescriptions  Pending Prescriptions Disp Refills  . pravastatin (PRAVACHOL) 40 MG tablet [Pharmacy Med Name: PRAVASTATIN 40MG  TABLETS] 90 tablet 1    Sig: TAKE 1 TABLET(40 MG) BY MOUTH DAILY     Cardiovascular:  Antilipid - Statins Failed - 12/22/2020  8:08 AM      Failed - LDL in normal range and within 360 days    LDL Chol Calc (NIH)  Date Value Ref Range Status  06/03/2020 105 (H) 0 - 99 mg/dL Final         Failed - HDL in normal range and within 360 days    HDL  Date Value Ref Range Status  06/03/2020 31 (L) >39 mg/dL Final         Passed - Total Cholesterol in normal range and within 360 days    Cholesterol, Total  Date Value Ref Range Status  06/03/2020 159 100 - 199 mg/dL Final         Passed - Triglycerides in normal range and within 360 days    Triglycerides  Date Value Ref Range Status  06/03/2020 124 0 - 149 mg/dL Final         Passed - Patient is not pregnant      Passed - Valid encounter within last 12 months    Recent Outpatient Visits          3 weeks ago Type 2 diabetes mellitus with morbid obesity (HCC)   [place] [facility] [place], [place] B, MD   6 months ago Type 2 diabetes mellitus with peripheral neuropathy [facility])   [place] [facility] B, MD   9 months ago Type 2 diabetes mellitus with diabetic polyneuropathy, without long-term current use of insulin (HCC)    [facility] [place] [facility], MD      Future Appointments            In 3 months Marcine Matar, Laural Benes, MD [facility]

## 2021-01-14 ENCOUNTER — Other Ambulatory Visit: Payer: Self-pay | Admitting: Internal Medicine

## 2021-01-14 ENCOUNTER — Other Ambulatory Visit: Payer: Self-pay

## 2021-01-14 MED ORDER — LISINOPRIL 5 MG PO TABS
5.0000 mg | ORAL_TABLET | Freq: Every day | ORAL | 2 refills | Status: DC
  Filled 2021-01-14: qty 30, 30d supply, fill #0
  Filled 2021-02-25: qty 30, 30d supply, fill #1
  Filled 2021-05-01: qty 30, 30d supply, fill #2

## 2021-01-14 NOTE — Telephone Encounter (Signed)
   Notes to clinic:  medication filled by a historical provider Review for refill Patient has been out for a week   Requested Prescriptions  Pending Prescriptions Disp Refills   lisinopril (ZESTRIL) 5 MG tablet      Sig: Take 1 tablet (5 mg total) by mouth daily.      Cardiovascular:  ACE Inhibitors Failed - 01/14/2021  9:35 AM      Failed - Cr in normal range and within 180 days    Creatinine, Ser  Date Value Ref Range Status  02/29/2020 0.69 0.57 - 1.00 mg/dL Final          Failed - K in normal range and within 180 days    Potassium  Date Value Ref Range Status  02/29/2020 3.9 3.5 - 5.2 mmol/L Final          Passed - Patient is not pregnant      Passed - Last BP in normal range    BP Readings from Last 1 Encounters:  11/28/20 125/76          Passed - Valid encounter within last 6 months    Recent Outpatient Visits           1 month ago Type 2 diabetes mellitus with morbid obesity (HCC)   [place] [facility] Jonah Blue B, MD   7 months ago Type 2 diabetes mellitus with peripheral neuropathy [facility])   [place] [facility] Jonah Blue B, MD   10 months ago Type 2 diabetes mellitus with diabetic polyneuropathy, without long-term current use of insulin [facility])    [facility] Marcine Matar, MD       Future Appointments             In 2 months Laural Benes Binnie Rail, MD [facility]

## 2021-01-14 NOTE — Telephone Encounter (Signed)
Medication: lisinopril (PRINIVIL,ZESTRIL) 5 MG tablet [[id number]]   Pt states she has been out of medication for a week  Has the patient contacted their pharmacy? YES  (Agent: If no, request that the patient contact the pharmacy for the refill.) (Agent: If yes, when and what did the pharmacy advise?)  Preferred Pharmacy (with phone number or street name): [facility] [address] [place] [address] Phone: [phone] Fax: [phone] Hours: M-F 8:30a-5:30p     Agent: Please be advised that RX refills may take up to 3 business days. We ask that you follow-up with your pharmacy.

## 2021-01-14 NOTE — Telephone Encounter (Signed)
   Notes to clinic:  duplicate request  Patient has appt on 03/31/2021   Requested Prescriptions  Pending Prescriptions Disp Refills   lisinopril (ZESTRIL) 5 MG tablet [Pharmacy Med Name: LISINOPRIL 5MG  TABLETS] 30 tablet     Sig: TAKE 1 TABLET(5 MG) BY MOUTH DAILY      Cardiovascular:  ACE Inhibitors Failed - 01/14/2021  9:40 AM      Failed - Cr in normal range and within 180 days    Creatinine, Ser  Date Value Ref Range Status  02/29/2020 0.69 0.57 - 1.00 mg/dL Final          Failed - K in normal range and within 180 days    Potassium  Date Value Ref Range Status  02/29/2020 3.9 3.5 - 5.2 mmol/L Final          Passed - Patient is not pregnant      Passed - Last BP in normal range    BP Readings from Last 1 Encounters:  11/28/20 125/76          Passed - Valid encounter within last 6 months    Recent Outpatient Visits           1 month ago Type 2 diabetes mellitus with morbid obesity (HCC)   [place] [facility] [place], [place] B, MD   7 months ago Type 2 diabetes mellitus with peripheral neuropathy [facility])   [place] [facility] B, MD   10 months ago Type 2 diabetes mellitus with diabetic polyneuropathy, without long-term current use of insulin [place])   [place] [facility], MD       Future Appointments             In 2 months Marcine Matar Laural Benes, MD [facility]

## 2021-01-17 ENCOUNTER — Other Ambulatory Visit: Payer: Self-pay

## 2021-01-27 ENCOUNTER — Other Ambulatory Visit: Payer: Self-pay | Admitting: Infectious Diseases

## 2021-01-27 DIAGNOSIS — Z21 Asymptomatic human immunodeficiency virus [HIV] infection status: Secondary | ICD-10-CM

## 2021-02-25 ENCOUNTER — Other Ambulatory Visit: Payer: Self-pay

## 2021-02-25 MED FILL — Insulin Pen Needle 31 G X 8 MM (1/3" or 5/16"): 25 days supply | Qty: 100 | Fill #0 | Status: AC

## 2021-03-04 ENCOUNTER — Other Ambulatory Visit: Payer: Self-pay

## 2021-03-04 ENCOUNTER — Other Ambulatory Visit: Payer: Self-pay | Admitting: Internal Medicine

## 2021-03-04 MED ORDER — HYDROCHLOROTHIAZIDE 25 MG PO TABS
25.0000 mg | ORAL_TABLET | Freq: Every day | ORAL | 0 refills | Status: DC
  Filled 2021-03-04: qty 30, 30d supply, fill #0

## 2021-03-04 NOTE — Telephone Encounter (Signed)
   Notes to clinic:  Patient would like to know if Dr. Laural Benes can fill medication    Requested Prescriptions  Pending Prescriptions Disp Refills   hydrochlorothiazide (HYDRODIURIL) 25 MG tablet      Sig: Take 1 tablet (25 mg total) by mouth daily.      Cardiovascular: Diuretics - Thiazide Failed - 03/04/2021  8:53 AM      Failed - Ca in normal range and within 360 days    Calcium  Date Value Ref Range Status  02/29/2020 9.5 8.7 - 10.2 mg/dL Final          Failed - Cr in normal range and within 360 days    Creatinine, Ser  Date Value Ref Range Status  02/29/2020 0.69 0.57 - 1.00 mg/dL Final          Failed - K in normal range and within 360 days    Potassium  Date Value Ref Range Status  02/29/2020 3.9 3.5 - 5.2 mmol/L Final          Failed - Na in normal range and within 360 days    Sodium  Date Value Ref Range Status  02/29/2020 135 134 - 144 mmol/L Final          Passed - Last BP in normal range    BP Readings from Last 1 Encounters:  11/28/20 125/76          Passed - Valid encounter within last 6 months    Recent Outpatient Visits           3 months ago Type 2 diabetes mellitus with morbid obesity (HCC)   [place] [facility] Jonah Blue B, MD   9 months ago Type 2 diabetes mellitus with peripheral neuropathy [facility])   [place] [facility] Jon[facility] Blue B, MD   1 year ago Type 2 diabetes mellitus with diabetic polyneuropathy, without long-term current use of insulin [facility])   [place] [facility] Marcine Matar, MD       Future Appointments             In 3 weeks Laural Benes Binnie Rail, MD [facility]

## 2021-03-04 NOTE — Telephone Encounter (Signed)
Medication Refill - Medication: hydrochlorothiazide (HYDRODIURIL) 25 MG tablet Pt wanted to know if Dr. Laural Benes would fill this medication / please advise   Has the patient contacted their pharmacy? No. (Agent: If no, request that the patient contact the pharmacy for the refill.) (Agent: If yes, when and what did the pharmacy advise?)  Preferred Pharmacy (with phone number or street name[facility]   Agent: Please be advised that RX refills may take up to 3 business days. We ask that you follow-up with your pharmacy.

## 2021-03-05 ENCOUNTER — Other Ambulatory Visit: Payer: Self-pay

## 2021-03-10 ENCOUNTER — Other Ambulatory Visit: Payer: Self-pay

## 2021-03-17 ENCOUNTER — Other Ambulatory Visit: Payer: Self-pay

## 2021-03-31 ENCOUNTER — Ambulatory Visit: Payer: Self-pay | Attending: Internal Medicine | Admitting: Internal Medicine

## 2021-03-31 ENCOUNTER — Other Ambulatory Visit: Payer: Self-pay

## 2021-03-31 DIAGNOSIS — E1159 Type 2 diabetes mellitus with other circulatory complications: Secondary | ICD-10-CM

## 2021-03-31 DIAGNOSIS — I152 Hypertension secondary to endocrine disorders: Secondary | ICD-10-CM

## 2021-03-31 DIAGNOSIS — Z23 Encounter for immunization: Secondary | ICD-10-CM

## 2021-03-31 DIAGNOSIS — E669 Obesity, unspecified: Secondary | ICD-10-CM

## 2021-03-31 DIAGNOSIS — E1169 Type 2 diabetes mellitus with other specified complication: Secondary | ICD-10-CM

## 2021-03-31 DIAGNOSIS — E785 Hyperlipidemia, unspecified: Secondary | ICD-10-CM

## 2021-03-31 NOTE — Progress Notes (Signed)
Patient ID: Jennifer Ford, female   DOB: 10-07-1960, 60 y.o.   MRN: [id number] Virtual Visit via Telephone Note  I connected with Jennifer Ford on 03/31/2021 at 8:35 AM by telephone and verified that I am speaking with the correct person using two identifiers  Location: Patient: home Provider: office  Participants: Myself Patient   I discussed the limitations, risks, security and privacy concerns of performing an evaluation and management service by telephone and the availability of in person appointments. I also discussed with the patient that there may be a patient responsible charge related to this service. The patient expressed understanding and agreed to proceed.   History of Present Illness: Pt with hx of HTN, DM with micoralbumin/polyneuropathy, obesity, HL, HIV.  Last seen 11/2020. This visit is for chronic ds management.  HM:  had nl MMG 02/19/21 through [facility].  Report viewed on Care Everywhere by me.  Due for DM eye exam.  Plans to go to [facility].  Due for Prevnar 20.  Got COVID-19 booster 03/27/21 Pfizer  DIABETES TYPE 2/Obesity Last A1C:   Lab Results  Component Value Date   HGBA1C 8.2 (H) 11/28/2020    Med Adherence:  [x]  Yes.  She is on Lantus 15 units daily and metformin 1000 mg twice a day. Medication side effects:  []  Yes    [x]  No Home Monitoring?  [x]  Yes occasionally   []  No Home glucose results range: checks BS occasionally Diet Adherence: "I don't eat as much bread as I use too."  She still drinks [facility] but no more than once a day; previously several times a day.  Drinking more water.  Wgh on last visit was 205 lbs with BMI 38.  She does not feel she has loss any wgh since last visit Exercise: [x]  Yes - does a lot of walking.  "More walking than sitting." Hypoglycemic episodes?: []  Yes    [x]  No Numbness of the feet? []  Yes    [x]  No -still gets some tingling at times in feet Retinopathy hx? []  Yes    [x]  No Last eye exam: see HM  above Comments:   HYPERTENSION Currently taking: see medication list Med Adherence: [x]  Yes.  On lisinopril and hydrochlorothiazide    []  No Medication side effects: []  Yes    [x]  No Adherence with salt restriction: [x]  Yes    []  No Home Monitoring?: []  Yes    [x]  No Monitoring Frequency:  Home BP results range:  SOB? []  Yes    [x]  No Chest Pain?: []  Yes    [x]  No Leg swelling?: []  Yes    [x]  No Headaches?: []  Yes    [x]  No Dizziness? []  Yes    [x]  No Comments:   HL:  taking Pravachol daily  Outpatient Encounter Medications as of 03/31/2021  Medication Sig   bictegravir-emtricitabine-tenofovir AF (BIKTARVY) 50-200-25 MG TABS tablet Take 1 tablet by mouth daily.   Blood Glucose Monitoring Suppl (TRUE METRIX METER) w/Device KIT Use as directed   cholecalciferol (VITAMIN D) 1000 UNITS tablet Take 1,000 Units by mouth daily.   efavirenz-emtricitabine-tenofovir (ATRIPLA) 600-200-300 MG tablet Take 1 tablet by mouth at bedtime.   hydrochlorothiazide (HYDRODIURIL) 25 MG tablet Take 1 tablet (25 mg total) by mouth daily.   insulin glargine (LANTUS SOLOSTAR) 100 UNIT/ML Solostar Pen Inject 15 Units into the skin at bedtime.   Insulin Pen Needle (PEN NEEDLES) 31G X 8 MM MISC UAD   lisinopril (ZESTRIL) 5 MG tablet  Take 1 tablet (5 mg total) by mouth daily.   metFORMIN (GLUCOPHAGE) 500 MG tablet Take 1,000 mg by mouth 2 (two) times daily with a meal.    Multiple Vitamin (MULTIVITAMIN WITH MINERALS) TABS tablet Take 1 tablet by mouth daily.   pravastatin (PRAVACHOL) 40 MG tablet TAKE 1 TABLET(40 MG) BY MOUTH DAILY   TRUEplus Lancets 28G MISC Use as directed   No facility-administered encounter medications on file as of 03/31/2021.      Observations/Objective: No direct observation done as this is a telephone encounter. Results for orders placed or performed in visit on 11/28/20  Hemoglobin A1c  Result Value Ref Range   Hgb A1c MFr Bld 8.2 (H) 4.8 - 5.6 %   Est. average glucose Bld gHb  Est-mCnc 189 mg/dL  POCT glucose (manual entry)  Result Value Ref Range   POC Glucose 149 (A) 70 - 99 mg/dl     Assessment and Plan: 1. Type 2 diabetes mellitus with obesity ([place]) Commended her on dietary changes that she has made so far.  Encouraged her to eliminate sugary drinks completely from her diet.  Continue current dose of Lantus and metformin.  Continue to stay active. - Hemoglobin A1c; Future - Microalbumin / creatinine urine ratio; Future  2. Hypertension associated with diabetes ([place]) Continue lisinopril and hydrochlorothiazide daily.  3. Hyperlipidemia associated with type 2 diabetes mellitus ([place]) Continue Pravachol.  Last LDL was not at goal.  She is agreeable to returning to the lab for recheck. - Lipid panel; Future  4. Need for vaccination against Streptococcus pneumoniae Due for Prevnar 20.  She will be scheduled to see the clinical pharmacist.   Follow Up Instruction 4 mths Give appt with clinical pharmacist for Prevnar 20 vaccine   I discussed the assessment and treatment plan with the patient. The patient was provided an opportunity to ask questions and all were answered. The patient agreed with the plan and demonstrated an understanding of the instructions.   The patient was advised to call back or seek an in-person evaluation if the symptoms worsen or if the condition fails to improve as anticipated.  I  Spent 16 minutes on this telephone encounter  Karle Plumber, MD

## 2021-04-01 ENCOUNTER — Other Ambulatory Visit: Payer: Self-pay

## 2021-04-01 DIAGNOSIS — E669 Obesity, unspecified: Secondary | ICD-10-CM

## 2021-04-01 DIAGNOSIS — E1169 Type 2 diabetes mellitus with other specified complication: Secondary | ICD-10-CM

## 2021-04-02 ENCOUNTER — Other Ambulatory Visit: Payer: Self-pay | Admitting: Internal Medicine

## 2021-04-02 LAB — HEMOGLOBIN A1C
Est. average glucose Bld gHb Est-mCnc: 169 mg/dL
Hgb A1c MFr Bld: 7.5 % — ABNORMAL HIGH (ref 4.8–5.6)

## 2021-04-02 LAB — LIPID PANEL
Chol/HDL Ratio: 4.1 ratio (ref 0.0–4.4)
Cholesterol, Total: 157 mg/dL (ref 100–199)
HDL: 38 mg/dL — ABNORMAL LOW (ref >39)
LDL Chol Calc (NIH): 103 mg/dL — ABNORMAL HIGH (ref 0–99)
Triglycerides: 84 mg/dL (ref 0–149)
VLDL Cholesterol Cal: 16 mg/dL (ref 5–40)

## 2021-04-02 LAB — MICROALBUMIN / CREATININE URINE RATIO
Creatinine, Urine: 100.3 mg/dL
Microalb/Creat Ratio: 6 mg/g creat (ref 0–29)
Microalbumin, Urine: 6.4 ug/mL

## 2021-04-02 MED ORDER — ATORVASTATIN CALCIUM 20 MG PO TABS: 20.0000 mg | ORAL_TABLET | Freq: Every day | ORAL | 4 refills | Status: DC

## 2021-04-02 NOTE — Progress Notes (Signed)
Let patient know that her hemoglobin A1c is 7.5 with goal being less than 7.  Prior to this it was 8.2 so it has improved.  Increase Lantus insulin from 15 units to 17 units daily.  Try to check blood sugars at least once a day in the mornings before breakfast with goal being 90-130.  LDL cholesterol is 103 with goal being less than 70.  I would like to change the Pravachol to a different cholesterol medication that would work a Buss better called atorvastatin.  Prescription sent to her pharmacy.

## 2021-04-17 ENCOUNTER — Telehealth: Payer: Self-pay

## 2021-04-17 NOTE — Telephone Encounter (Signed)
-----   Message from Marcine Matar, MD sent at 03/31/2021  8:55 AM EDT ----- Give f/u appt in 4 mths Give appt with Franky Macho in 1-2 wks for Prevnar 20 vaccine.

## 2021-04-17 NOTE — Telephone Encounter (Signed)
Pt requested a call back from [place], stated she is by her phone.

## 2021-04-17 NOTE — Telephone Encounter (Signed)
Pt was called and a VM was left informing patient to return phone call. 

## 2021-04-28 ENCOUNTER — Other Ambulatory Visit: Payer: Self-pay | Admitting: Internal Medicine

## 2021-04-29 NOTE — Telephone Encounter (Signed)
Requested medications are due for refill today.  yes  Requested medications are on the active medications list.  yes  Last refill. 03/04/2021  Future visit scheduled.   yes  Notes to clinic.  Labs are expired.

## 2021-04-30 ENCOUNTER — Other Ambulatory Visit: Payer: Self-pay

## 2021-04-30 MED ORDER — HYDROCHLOROTHIAZIDE 25 MG PO TABS
25.0000 mg | ORAL_TABLET | Freq: Every day | ORAL | 2 refills | Status: DC
  Filled 2021-04-30: qty 30, 30d supply, fill #0
  Filled 2021-06-02: qty 30, 30d supply, fill #1

## 2021-05-01 ENCOUNTER — Other Ambulatory Visit: Payer: Self-pay

## 2021-05-05 ENCOUNTER — Ambulatory Visit: Payer: Self-pay | Admitting: Pharmacist

## 2021-05-13 NOTE — Telephone Encounter (Signed)
error 

## 2021-05-19 ENCOUNTER — Other Ambulatory Visit: Payer: Self-pay

## 2021-06-02 ENCOUNTER — Other Ambulatory Visit: Payer: Self-pay | Admitting: Internal Medicine

## 2021-06-02 ENCOUNTER — Other Ambulatory Visit: Payer: Self-pay

## 2021-06-02 NOTE — Telephone Encounter (Signed)
Requested medication (s) are due for refill today: Yes  Requested medication (s) are on the active medication list: Yes  Last refill:  01/14/21  Future visit scheduled: Yes  Notes to clinic:  Protocol indicates pt. Needs lab work.    Requested Prescriptions  Pending Prescriptions Disp Refills   lisinopril (ZESTRIL) 5 MG tablet 30 tablet 2    Sig: Take 1 tablet (5 mg total) by mouth daily.     Cardiovascular:  ACE Inhibitors Failed - 06/02/2021  9:02 AM      Failed - Cr in normal range and within 180 days    Creatinine, Ser  Date Value Ref Range Status  02/29/2020 0.69 0.57 - 1.00 mg/dL Final          Failed - K in normal range and within 180 days    Potassium  Date Value Ref Range Status  02/29/2020 3.9 3.5 - 5.2 mmol/L Final          Passed - Patient is not pregnant      Passed - Last BP in normal range    BP Readings from Last 1 Encounters:  11/28/20 125/76          Passed - Valid encounter within last 6 months    Recent Outpatient Visits           2 months ago Type 2 diabetes mellitus with obesity (HCC)   [place] [facility] Jonah Blue B, MD   6 months ago Type 2 diabetes mellitus with morbid obesity [facility])   [place] Marcine Matar, MD   1 year ago Type 2 diabetes mellitus with peripheral neuropathy [facility])   [place] [facility] Jonah Blue B, MD   1 year ago Type 2 diabetes mellitus with diabetic polyneuropathy, without long-term current use of insulin [facility])   [place] [facility] Marcine Matar, MD       Future Appointments             In 2 months Laural Benes Binnie Rail, MD [facility]

## 2021-06-03 ENCOUNTER — Other Ambulatory Visit: Payer: Self-pay

## 2021-06-03 MED ORDER — LISINOPRIL 5 MG PO TABS
5.0000 mg | ORAL_TABLET | Freq: Every day | ORAL | 2 refills | Status: DC
  Filled 2021-06-03: qty 30, 30d supply, fill #0
  Filled 2021-08-05: qty 30, 30d supply, fill #1

## 2021-06-05 ENCOUNTER — Other Ambulatory Visit: Payer: Self-pay | Admitting: Internal Medicine

## 2021-06-05 NOTE — Telephone Encounter (Signed)
Medication Refill - Medication:  metFORMIN (GLUCOPHAGE) 500 MG tablet   Has the patient contacted their pharmacy? No. Pt hasnt been taking medication in a while, and had none on file  Preferred Pharmacy (with phone number or street name):  [facility]  Phone:  [phone] Fax:  [phone]  Has the patient been seen for an appointment in the last year OR does the patient have an upcoming appointment? Yes.    Agent: Please be advised that RX refills may take up to 3 business days. We ask that you follow-up with your pharmacy.

## 2021-06-05 NOTE — Telephone Encounter (Signed)
Requested medication (s) are due for refill today: Yes  Requested medication (s) are on the active medication list: No (historical)  Last refill:  9 years ago  Future visit scheduled: Yes  Notes to clinic:  pt has been taking this medication but was prescribed by another provider. Pt needs this medication refilled.      Requested Prescriptions  Pending Prescriptions Disp Refills   metFORMIN (GLUCOPHAGE) 500 MG tablet      Sig: Take 2 tablets (1,000 mg total) by mouth 2 (two) times daily with a meal.     Endocrinology:  Diabetes - Biguanides Failed - 06/05/2021  3:31 PM      Failed - Cr in normal range and within 360 days    Creatinine, Ser  Date Value Ref Range Status  02/29/2020 0.69 0.57 - 1.00 mg/dL Final          Failed - AA eGFR in normal range and within 360 days    GFR calc Af Amer  Date Value Ref Range Status  02/29/2020 111 >59 mL/min/1.73 Final    Comment:    **[facility] currently reports eGFR in compliance with the current**   recommendations of the [employer]. [facility] will   update reporting as new guidelines are published from the NKF-ASN   Task force.    GFR calc non Af Amer  Date Value Ref Range Status  02/29/2020 96 >59 mL/min/1.73 Final          Passed - HBA1C is between 0 and 7.9 and within 180 days    HbA1c, POC (controlled diabetic range)  Date Value Ref Range Status  02/29/2020 10.2 (A) 0.0 - 7.0 % Final   Hgb A1c MFr Bld  Date Value Ref Range Status  04/01/2021 7.5 (H) 4.8 - 5.6 % Final    Comment:             Prediabetes: 5.7 - 6.4          Diabetes: >6.4          Glycemic control for adults with diabetes: <7.0           Passed - Valid encounter within last 6 months    Recent Outpatient Visits           2 months ago Type 2 diabetes mellitus with obesity ([place])   [place], Neoma Laming B, MD   6 months ago Type 2 diabetes mellitus with morbid obesity [facility])   [place], Deborah B, MD   1 year ago Type 2 diabetes mellitus with peripheral neuropathy [facility])   [place], Deborah B, MD   1 year ago Type 2 diabetes mellitus with diabetic polyneuropathy, without long-term current use of insulin [facility])   [place], MD       Future Appointments             In 2 months Wynetta Emery Dalbert Batman, MD [place]

## 2021-06-06 MED ORDER — METFORMIN HCL 500 MG PO TABS: 1000.0000 mg | ORAL_TABLET | Freq: Two times a day (BID) | ORAL | 2 refills | Status: DC

## 2021-06-11 ENCOUNTER — Other Ambulatory Visit: Payer: Self-pay | Admitting: Internal Medicine

## 2021-06-11 ENCOUNTER — Other Ambulatory Visit: Payer: Self-pay

## 2021-06-11 DIAGNOSIS — E1142 Type 2 diabetes mellitus with diabetic polyneuropathy: Secondary | ICD-10-CM

## 2021-06-11 MED ORDER — TRUEPLUS 5-BEVEL PEN NEEDLES 31G X 8 MM MISC
3 refills | Status: DC
  Filled 2021-06-11: qty 100, 100d supply, fill #0
  Filled 2021-09-29: qty 100, 25d supply, fill #0
  Filled 2022-04-08: qty 100, 25d supply, fill #1

## 2021-06-11 MED ORDER — LANTUS SOLOSTAR 100 UNIT/ML ~~LOC~~ SOPN
15.0000 [IU] | PEN_INJECTOR | Freq: Every day | SUBCUTANEOUS | 0 refills | Status: DC
  Filled 2021-06-11: qty 15, 100d supply, fill #0

## 2021-06-11 NOTE — Telephone Encounter (Signed)
Requested Prescriptions  Pending Prescriptions Disp Refills  . insulin glargine (LANTUS SOLOSTAR) 100 UNIT/ML Solostar Pen 15 mL 0    Sig: Inject 15 Units into the skin at bedtime.     Endocrinology:  Diabetes - Insulins Passed - 06/11/2021 12:23 AM      Passed - HBA1C is between 0 and 7.9 and within 180 days    HbA1c, POC (controlled diabetic range)  Date Value Ref Range Status  02/29/2020 10.2 (A) 0.0 - 7.0 % Final   Hgb A1c MFr Bld  Date Value Ref Range Status  04/01/2021 7.5 (H) 4.8 - 5.6 % Final    Comment:             Prediabetes: 5.7 - 6.4          Diabetes: >6.4          Glycemic control for adults with diabetes: <7.0          Passed - Valid encounter within last 6 months    Recent Outpatient Visits          2 months ago Type 2 diabetes mellitus with obesity (HCC)   [place] [facility] [place], Gavin Pound B, MD   6 months ago Type 2 diabetes mellitus with morbid obesity [facility])   [place] [facility] Marcine Matar, MD   1 year ago Type 2 diabetes mellitus with peripheral neuropathy [facility])   [place] [facility] Jonah Blue B, MD   1 year ago Type 2 diabetes mellitus with diabetic polyneuropathy, without long-term current use of insulin [facility])   [place] [facility] Marcine Matar, MD      Future Appointments            In 2 months Laural Benes, Binnie Rail, MD [facility]           . Insulin Pen Needle (TRUEPLUS PEN NEEDLES) 31G X 8 MM MISC 100 each 6    Sig: USE AS DIRECTED     Endocrinology: Diabetes - Testing Supplies Passed - 06/11/2021 12:23 AM      Passed - Valid encounter within last 12 months    Recent Outpatient Visits          2 months ago Type 2 diabetes mellitus with obesity (HCC)   [place] [facility] Jonah Blue B, MD   6 months ago Type 2 diabetes mellitus with morbid obesity [facility])   [place]  [facility] Marcine Matar, MD   1 year ago Type 2 diabetes mellitus with peripheral neuropathy [facility])   [place] [facility] Jonah Blue B, MD   1 year ago Type 2 diabetes mellitus with diabetic polyneuropathy, without long-term current use of insulin [facility])   [facility] Marcine Matar, MD      Future Appointments            In 2 months Laural Benes, Binnie Rail, MD [facility]

## 2021-06-12 ENCOUNTER — Other Ambulatory Visit: Payer: Self-pay | Admitting: Internal Medicine

## 2021-06-12 ENCOUNTER — Other Ambulatory Visit: Payer: Self-pay

## 2021-06-13 ENCOUNTER — Other Ambulatory Visit: Payer: Self-pay

## 2021-06-13 NOTE — Telephone Encounter (Signed)
Requested Prescriptions  Pending Prescriptions Disp Refills  . metFORMIN (GLUCOPHAGE) 500 MG tablet 120 tablet 2    Sig: Take 2 tablets (1,000 mg total) by mouth 2 (two) times daily with a meal.     Endocrinology:  Diabetes - Biguanides Failed - 06/12/2021 10:15 AM      Failed - Cr in normal range and within 360 days    Creatinine, Ser  Date Value Ref Range Status  02/29/2020 0.69 0.57 - 1.00 mg/dL Final         Failed - AA eGFR in normal range and within 360 days    GFR calc Af Amer  Date Value Ref Range Status  02/29/2020 111 >59 mL/min/1.73 Final    Comment:    **[facility] currently reports eGFR in compliance with the current**   recommendations of the [employer]. [facility] will   update reporting as new guidelines are published from the NKF-ASN   Task force.    GFR calc non Af Amer  Date Value Ref Range Status  02/29/2020 96 >59 mL/min/1.73 Final         Passed - HBA1C is between 0 and 7.9 and within 180 days    HbA1c, POC (controlled diabetic range)  Date Value Ref Range Status  02/29/2020 10.2 (A) 0.0 - 7.0 % Final   Hgb A1c MFr Bld  Date Value Ref Range Status  04/01/2021 7.5 (H) 4.8 - 5.6 % Final    Comment:             Prediabetes: 5.7 - 6.4          Diabetes: >6.4          Glycemic control for adults with diabetes: <7.0          Passed - Valid encounter within last 6 months    Recent Outpatient Visits          2 months ago Type 2 diabetes mellitus with obesity ([place])   [place], Neoma Laming B, MD   6 months ago Type 2 diabetes mellitus with morbid obesity [facility])   [place], Deborah B, MD   1 year ago Type 2 diabetes mellitus with peripheral neuropathy [facility])   [place], Deborah B, MD   1 year ago Type 2 diabetes mellitus with diabetic polyneuropathy, without long-term current use of insulin [facility])   [place], MD      Future Appointments            In 2 months Wynetta Emery Dalbert Batman, MD [place]

## 2021-06-13 NOTE — Telephone Encounter (Signed)
This Rx for metformin was refused because it was signed and refilled on 06/06/2021  #120, 0 refills.

## 2021-07-29 ENCOUNTER — Other Ambulatory Visit: Payer: Self-pay | Admitting: Internal Medicine

## 2021-07-29 NOTE — Telephone Encounter (Signed)
Requested medication (s) are due for refill today: yes  Requested medication (s) are on the active medication list: yes  Last refill:  04/02/21 #30 4 Rf  Future visit scheduled: yes  Notes to clinic: overdue lab work   Requested Prescriptions  Pending Prescriptions Disp Refills   atorvastatin (LIPITOR) 20 MG tablet [Pharmacy Med Name: ATORVASTATIN 20MG  TABLETS] 30 tablet 4    Sig: TAKE 1 TABLET(20 MG) BY MOUTH DAILY. STOP PRAVASTATIN     Cardiovascular:  Antilipid - Statins Failed - 07/29/2021  8:08 AM      Failed - LDL in normal range and within 360 days    LDL Chol Calc (NIH)  Date Value Ref Range Status  04/01/2021 103 (H) 0 - 99 mg/dL Final          Failed - HDL in normal range and within 360 days    HDL  Date Value Ref Range Status  04/01/2021 38 (L) >39 mg/dL Final          Passed - Total Cholesterol in normal range and within 360 days    Cholesterol, Total  Date Value Ref Range Status  04/01/2021 157 100 - 199 mg/dL Final          Passed - Triglycerides in normal range and within 360 days    Triglycerides  Date Value Ref Range Status  04/01/2021 84 0 - 149 mg/dL Final          Passed - Patient is not pregnant      Passed - Valid encounter within last 12 months    Recent Outpatient Visits           4 months ago Type 2 diabetes mellitus with obesity (HCC)   [place] [facility] [place], [place] B, MD   8 months ago Type 2 diabetes mellitus with morbid obesity (HCC)   [place] [facility] Gavin Pound B, MD   1 year ago Type 2 diabetes mellitus with peripheral neuropathy [facility])   [place] [facility] B, MD   1 year ago Type 2 diabetes mellitus with diabetic polyneuropathy, without long-term current use of insulin [facility])   [place] [facility], MD       Future Appointments             In 3 weeks Marcine Matar Laural Benes, MD [facility]

## 2021-08-05 ENCOUNTER — Other Ambulatory Visit: Payer: Self-pay

## 2021-08-12 ENCOUNTER — Telehealth: Payer: Self-pay

## 2021-08-12 ENCOUNTER — Ambulatory Visit: Payer: Self-pay

## 2021-08-12 ENCOUNTER — Other Ambulatory Visit: Payer: Self-pay

## 2021-08-12 MED ORDER — METFORMIN HCL 500 MG PO TABS
1000.0000 mg | ORAL_TABLET | Freq: Two times a day (BID) | ORAL | 0 refills | Status: DC
  Filled 2021-08-12: qty 120, 30d supply, fill #0

## 2021-08-12 NOTE — Telephone Encounter (Signed)
Pt requesting change of pharmacy to [facility] pharmacy. #120 no refills Requested Prescriptions  Pending Prescriptions Disp Refills   metFORMIN (GLUCOPHAGE) 500 MG tablet 120 tablet 0    Sig: Take 2 tablets (1,000 mg total) by mouth 2 (two) times daily with a meal.     There is no refill protocol information for this order     Last RF 06/06/21  #120 with 2 RF Ref due yes  Active med list: yes Last Hgb A1C 04/01/21 FVS: yes

## 2021-08-12 NOTE — Telephone Encounter (Signed)
Called and LM on Vm that will send RF directly to [facility]. Advised to call back for questions.

## 2021-08-12 NOTE — Telephone Encounter (Signed)
Sent 1 month refill to [facility] pharmacy

## 2021-08-14 ENCOUNTER — Other Ambulatory Visit: Payer: Self-pay

## 2021-08-22 ENCOUNTER — Encounter: Payer: Self-pay | Admitting: Internal Medicine

## 2021-08-22 ENCOUNTER — Other Ambulatory Visit: Payer: Self-pay | Admitting: Pharmacist

## 2021-08-22 ENCOUNTER — Other Ambulatory Visit: Payer: Self-pay

## 2021-08-22 ENCOUNTER — Ambulatory Visit: Payer: Self-pay | Attending: Internal Medicine | Admitting: Internal Medicine

## 2021-08-22 ENCOUNTER — Other Ambulatory Visit (HOSPITAL_COMMUNITY): Payer: Self-pay

## 2021-08-22 VITALS — BP 111/68 | HR 64 | Resp 16 | Wt 199.2 lb

## 2021-08-22 DIAGNOSIS — Z21 Asymptomatic human immunodeficiency virus [HIV] infection status: Secondary | ICD-10-CM

## 2021-08-22 DIAGNOSIS — E785 Hyperlipidemia, unspecified: Secondary | ICD-10-CM

## 2021-08-22 DIAGNOSIS — E669 Obesity, unspecified: Secondary | ICD-10-CM

## 2021-08-22 DIAGNOSIS — E1169 Type 2 diabetes mellitus with other specified complication: Secondary | ICD-10-CM

## 2021-08-22 DIAGNOSIS — I152 Hypertension secondary to endocrine disorders: Secondary | ICD-10-CM

## 2021-08-22 DIAGNOSIS — E1159 Type 2 diabetes mellitus with other circulatory complications: Secondary | ICD-10-CM

## 2021-08-22 DIAGNOSIS — Z1211 Encounter for screening for malignant neoplasm of colon: Secondary | ICD-10-CM

## 2021-08-22 DIAGNOSIS — E1142 Type 2 diabetes mellitus with diabetic polyneuropathy: Secondary | ICD-10-CM

## 2021-08-22 DIAGNOSIS — Z1159 Encounter for screening for other viral diseases: Secondary | ICD-10-CM

## 2021-08-22 LAB — GLUCOSE, POCT (MANUAL RESULT ENTRY): POC Glucose: 138 mg/dl — AB (ref 70–99)

## 2021-08-22 LAB — POCT GLYCOSYLATED HEMOGLOBIN (HGB A1C): HbA1c, POC (controlled diabetic range): 8.1 % — AB (ref 0.0–7.0)

## 2021-08-22 MED ORDER — TRUE METRIX BLOOD GLUCOSE TEST VI STRP
ORAL_STRIP | 12 refills | Status: DC
  Filled 2021-08-22: qty 100, fill #0

## 2021-08-22 MED ORDER — LANTUS SOLOSTAR 100 UNIT/ML ~~LOC~~ SOPN
17.0000 [IU] | PEN_INJECTOR | Freq: Every day | SUBCUTANEOUS | 2 refills | Status: DC
  Filled 2021-08-22: qty 15, 88d supply, fill #0
  Filled 2021-09-29: qty 6, 35d supply, fill #0
  Filled 2021-11-17: qty 6, 35d supply, fill #1
  Filled 2021-11-20: qty 6, 35d supply, fill #0
  Filled 2021-12-25: qty 3, 17d supply, fill #1
  Filled 2021-12-25: qty 18, 105d supply, fill #1
  Filled 2022-06-08: qty 15, 88d supply, fill #2

## 2021-08-22 MED ORDER — TRUE METRIX METER W/DEVICE KIT
PACK | 0 refills | Status: DC
  Filled 2021-08-22: qty 1, 30d supply, fill #0

## 2021-08-22 MED ORDER — ATORVASTATIN CALCIUM 20 MG PO TABS
20.0000 mg | ORAL_TABLET | Freq: Every day | ORAL | 2 refills | Status: DC
Start: 2021-08-22 — End: 2022-05-01
  Filled 2021-08-22 – 2021-12-25 (×2): qty 90, 90d supply, fill #0
  Filled 2022-03-24: qty 90, 90d supply, fill #1

## 2021-08-22 MED ORDER — HYDROCHLOROTHIAZIDE 25 MG PO TABS
25.0000 mg | ORAL_TABLET | Freq: Every day | ORAL | 2 refills | Status: DC
  Filled 2021-08-22: qty 90, 90d supply, fill #0
  Filled 2022-05-26: qty 90, 90d supply, fill #1

## 2021-08-22 MED ORDER — TRUEPLUS LANCETS 26G MISC
5 refills | Status: DC
  Filled 2021-08-22: qty 100, fill #0

## 2021-08-22 MED ORDER — METFORMIN HCL 1000 MG PO TABS
1000.0000 mg | ORAL_TABLET | Freq: Two times a day (BID) | ORAL | 2 refills | Status: DC
  Filled 2021-08-22: qty 180, 90d supply, fill #0
  Filled 2022-01-26: qty 60, 30d supply, fill #1
  Filled 2022-03-22 – 2022-03-24 (×2): qty 60, 30d supply, fill #2
  Filled 2022-05-05: qty 60, 30d supply, fill #3
  Filled 2022-07-20: qty 60, 30d supply, fill #4
  Filled 2022-08-14: qty 60, 30d supply, fill #5

## 2021-08-22 MED ORDER — TRUEPLUS LANCETS 28G MISC
4 refills | Status: AC
  Filled 2021-08-22: qty 100, 33d supply, fill #0

## 2021-08-22 MED ORDER — TRUE METRIX METER W/DEVICE KIT
PACK | 0 refills | Status: AC
  Filled 2021-08-22: qty 1, 1d supply, fill #0
  Filled 2021-08-22: qty 1, fill #0

## 2021-08-22 MED ORDER — TRUE METRIX BLOOD GLUCOSE TEST VI STRP
ORAL_STRIP | 12 refills | Status: AC
  Filled 2021-08-22: qty 100, fill #0
  Filled 2021-08-22: qty 100, 33d supply, fill #0

## 2021-08-22 MED ORDER — LISINOPRIL 5 MG PO TABS
5.0000 mg | ORAL_TABLET | Freq: Every day | ORAL | 2 refills | Status: DC
  Filled 2021-08-22 – 2021-12-25 (×3): qty 90, 90d supply, fill #0
  Filled 2022-06-08: qty 90, 90d supply, fill #1

## 2021-08-22 NOTE — Progress Notes (Signed)
Patient ID: Jennifer EwingsLinda Holton, female    DOB: 08-Sep-1960  MRN: [id number]  CC: Diabetes and Hypertension   Subjective: Jennifer Ford is a 61 y.o. female who presents for chronic ds management Her concerns today include:  Pt with hx of HTN, DM with micoralbumin/polyneuropathy, obesity, HL, HIV.   DIABETES TYPE 2 Last A1C:   Results for orders placed or performed in visit on 08/22/21  POCT glucose (manual entry)  Result Value Ref Range   POC Glucose 138 (A) 70 - 99 mg/dl  POCT glycosylated hemoglobin (Hb A1C)  Result Value Ref Range   Hemoglobin A1C     HbA1c POC (<> result, manual entry)     HbA1c, POC (prediabetic range)     HbA1c, POC (controlled diabetic range) 8.1 (A) 0.0 - 7.0 %    Med Adherence:  [x]  Yes  -Lantus 15 units daily (suppose to be 17), and Metformin BID Medication side effects:  []  Yes    [x]  No Home Monitoring?  []  Yes    [x]  No -meter does not work; needs new one Home glucose results range: Diet Adherence: "backing off sodas more."  Eating less.  Down 6 lbs since 11/2020 Exercise: []  Yes    [x]  No Hypoglycemic episodes?: []  Yes    []  No Numbness of the feet? []  Yes    [x]  No but tingling at times Retinopathy hx? []  Yes    []  No Last eye exam: over due for exam.  No insurance.  No blurred vision. Comments:   HYPERTENSION Currently taking: see medication list.  On HCTZ and lisinopril Med Adherence: [x]  Yes    []  No Medication side effects: []  Yes    [x]  No Adherence with salt restriction: [x]  Yes    []  No Home Monitoring?: [x]  Yes -occasionally   []  No Monitoring Frequency:  Home BP results range:  SOB? []  Yes    [x]  No Chest Pain?: []  Yes    [x]  No Leg swelling?: []  Yes    [x]  No Headaches?: []  Yes    [x]  No Dizziness? []  Yes    [x]  No Comments:   HL:  Pravachol changed to Lipitor after last visit.  Taking and tolerating the Lipitor.  HIV: [facility].  Last seen in September of last year.  She reports  compliance with her medications.  No symptoms at this time. Had a cold about 2 wks ago.  COVID test neg  She had flu vaccine and Prevnar 13 on her last visit with her ID specialist on 05/02/2021.  Patient Active Problem List   Diagnosis Date Noted   Class 2 severe obesity due to excess calories with serious comorbidity and body mass index (BMI) of 38.0 to 38.9 in adult [facility](HCC) 02/29/2020   Microalbuminuria 02/29/2020   Positive depression screening 02/29/2020   Hyperlipidemia associated with type 2 diabetes mellitus (HCC) 02/29/2020   Essential hypertension 02/29/2020   Type 2 diabetes mellitus with diabetic polyneuropathy, without long-term current use of insulin (HCC) 02/29/2020   Asymptomatic HIV infection (HCC) 02/29/2020     Current Outpatient Medications on File Prior to Visit  Medication Sig Dispense Refill   bictegravir-emtricitabine-tenofovir AF (BIKTARVY) 50-200-25 MG TABS tablet Take 1 tablet by mouth daily.     cholecalciferol (VITAMIN D) 1000 UNITS tablet Take 1,000 Units by mouth daily.     efavirenz-emtricitabine-tenofovir (ATRIPLA) 600-200-300 MG tablet Take 1 tablet by mouth at bedtime.     Insulin Pen  Needle (PEN NEEDLES) 31G X 8 MM MISC UAD 100 each 6   Insulin Pen Needle (TRUEPLUS 5-BEVEL PEN NEEDLES) 31G X 8 MM MISC See admin instructions. 100 each 3   Multiple Vitamin (MULTIVITAMIN WITH MINERALS) TABS tablet Take 1 tablet by mouth daily.     No current facility-administered medications on file prior to visit.    No Known Allergies  Social History   Socioeconomic History   Marital status: Single    Spouse name: Not on file   Number of children: 2   Years of education: Not on file   Highest education level: Not on file  Occupational History   Not on file  Tobacco Use   Smoking status: Never   Smokeless tobacco: Never  Vaping Use   Vaping Use: Never used  Substance and Sexual Activity   Alcohol use: Yes    Comment: occasionally   Drug use: No   Sexual  activity: Not on file  Other Topics Concern   Not on file  Social History Narrative   Not on file   Social Determinants of Health   Financial Resource Strain: Not on file  Food Insecurity: Not on file  Transportation Needs: Not on file  Physical Activity: Not on file  Stress: Not on file  Social Connections: Not on file  Intimate Partner Violence: Not on file    Family History  Problem Relation Age of Onset   Diabetes Mother    Hypertension Mother    Hypertension Sister    Diabetes Maternal Grandmother    Diabetes Maternal Grandfather     Past Surgical History:  Procedure Laterality Date   ABDOMINAL HYSTERECTOMY     BREAST REDUCTION SURGERY Bilateral     ROS: Review of Systems Negative except as stated above  PHYSICAL EXAM: BP 111/68    Pulse 64    Resp 16    Wt 199 lb 3.2 oz (90.4 kg)    SpO2 97%    BMI 36.43 kg/m   Wt Readings from Last 3 Encounters:  08/22/21 199 lb 3.2 oz (90.4 kg)  11/28/20 205 lb 3.2 oz (93.1 kg)  02/29/20 209 lb 3.2 oz (94.9 kg)    Physical Exam  General appearance - alert, well appearing, and in no distress Mental status - normal mood, behavior, speech, dress, motor activity, and thought processes Mouth - mucous membranes moist, pharynx normal without lesions Neck - supple, no significant adenopathy Chest - clear to auscultation, no wheezes, rales or rhonchi, symmetric air entry Heart - normal rate, regular rhythm, normal S1, S2, no murmurs, rubs, clicks or gallops Extremities - peripheral pulses normal, no pedal edema, no clubbing or cyanosis Diabetic Foot Exam - Simple   Simple Foot Form Diabetic Foot exam was performed with the following findings: Yes 08/22/2021 10:24 AM  Visual Inspection No deformities, no ulcerations, no other skin breakdown bilaterally: Yes Sensation Testing Intact to touch and monofilament testing bilaterally: Yes Pulse Check Posterior Tibialis and Dorsalis pulse intact bilaterally: Yes Comments       CMP Latest Ref Rng & Units 02/29/2020  Glucose 65 - 99 mg/dL 270(W)  BUN 6 - 24 mg/dL 13  Creatinine 2.37 - 6.28 mg/dL 3.15  Sodium 176 - 160 mmol/L 135  Potassium 3.5 - 5.2 mmol/L 3.9  Chloride 96 - 106 mmol/L 96  CO2 20 - 29 mmol/L 27  Calcium 8.7 - 10.2 mg/dL 9.5  Total Protein 6.0 - 8.5 g/dL 6.8  Total Bilirubin 0.0 - 1.2 mg/dL <  0.2  Alkaline Phos 48 - 121 IU/L 93  AST 0 - 40 IU/L 13  ALT 0 - 32 IU/L 13   Lipid Panel     Component Value Date/Time   CHOL 157 04/01/2021 0901   TRIG 84 04/01/2021 0901   HDL 38 (L) 04/01/2021 0901   CHOLHDL 4.1 04/01/2021 0901   LDLCALC 103 (H) 04/01/2021 0901    CBC    Component Value Date/Time   WBC 8.3 02/29/2020 1525   RBC 4.73 02/29/2020 1525   HGB 14.0 02/29/2020 1525   HCT 43.1 02/29/2020 1525   PLT 323 02/29/2020 1525   MCV 91 02/29/2020 1525   MCH 29.6 02/29/2020 1525   MCHC 32.5 02/29/2020 1525   RDW 12.8 02/29/2020 1525    ASSESSMENT AND PLAN:  1. Type 2 diabetes mellitus with obesity (HCC) Discussed the importance of healthy eating habits, regular aerobic exercise (at least 150 minutes a week as tolerated) and medication compliance to achieve or maintain control of diabetes. Inc Lantus to 17 units daily Get [facility] at [facility] when she can afford to do so - POCT glucose (manual entry) - POCT glycosylated hemoglobin (Hb A1C) - metFORMIN (GLUCOPHAGE) 1000 MG tablet; Take 1 tablet (1,000 mg total) by mouth 2 (two) times daily with a meal.  Dispense: 180 tablet; Refill: 2 - insulin glargine (LANTUS SOLOSTAR) 100 UNIT/ML Solostar Pen; Inject 17 Units into the skin at bedtime.  Dispense: 15 mL; Refill: 2  2. Hyperlipidemia associated with type 2 diabetes mellitus (HCC) - atorvastatin (LIPITOR) 20 MG tablet; Take 1 tablet (20 mg total) by mouth daily. Stop pravastatin  Dispense: 90 tablet; Refill: 2  3. Hypertension associated with diabetes (HCC) At goal - hydrochlorothiazide (HYDRODIURIL) 25 MG tablet; Take  1 tablet (25 mg total) by mouth daily.  Dispense: 90 tablet; Refill: 2 - lisinopril (ZESTRIL) 5 MG tablet; Take 1 tablet (5 mg total) by mouth daily.  Dispense: 90 tablet; Refill: 2  4. Asymptomatic HIV infection (HCC) Most recent level undetectable.  Followed by ID  5. Screening for colon cancer - Fecal occult blood, imunochemical  6. Need for hepatitis C screening test - HCV Ab w Reflex to Quant PCR    Patient was given the opportunity to ask questions.  Patient verbalized understanding of the plan and was able to repeat key elements of the plan.   Orders Placed This Encounter  Procedures   Fecal occult blood, imunochemical   HCV Ab w Reflex to Quant PCR   POCT glucose (manual entry)   POCT glycosylated hemoglobin (Hb A1C)     Requested Prescriptions   Signed Prescriptions Disp Refills   atorvastatin (LIPITOR) 20 MG tablet 90 tablet 2    Sig: Take 1 tablet (20 mg total) by mouth daily. Stop pravastatin   metFORMIN (GLUCOPHAGE) 1000 MG tablet 180 tablet 2    Sig: Take 1 tablet (1,000 mg total) by mouth 2 (two) times daily with a meal.   hydrochlorothiazide (HYDRODIURIL) 25 MG tablet 90 tablet 2    Sig: Take 1 tablet (25 mg total) by mouth daily.   lisinopril (ZESTRIL) 5 MG tablet 90 tablet 2    Sig: Take 1 tablet (5 mg total) by mouth daily.   insulin glargine (LANTUS SOLOSTAR) 100 UNIT/ML Solostar Pen 15 mL 2    Sig: Inject 17 Units into the skin at bedtime.    Return in about 4 months (around 12/20/2021).  Jonah Blue, MD, FACP

## 2021-08-22 NOTE — Patient Instructions (Signed)
Increase Lantus insulin to 17 units daily. Goal for blood sugar readings before meals is 90-130. I have sent prescription to [facility] pharmacy for new diabetic testing supplies. Please note that I have updated your metformin prescription to 1000 mg.  This means she will have to take 1 tablet twice a day. Please try to get your eye exam done as soon as possible.

## 2021-08-23 LAB — HCV AB W REFLEX TO QUANT PCR: HCV Ab: <0.1 s/co ratio (ref 0.0–0.9)

## 2021-08-23 LAB — HCV INTERPRETATION

## 2021-08-26 ENCOUNTER — Telehealth: Payer: Self-pay

## 2021-08-26 NOTE — Telephone Encounter (Signed)
Contacted pt to go over lab results pt is aware and doesn't have any questions or concerns 

## 2021-09-07 LAB — FECAL OCCULT BLOOD, IMMUNOCHEMICAL: Fecal Occult Bld: NEGATIVE

## 2021-09-09 ENCOUNTER — Other Ambulatory Visit: Payer: Self-pay

## 2021-09-18 ENCOUNTER — Other Ambulatory Visit (HOSPITAL_COMMUNITY): Payer: Self-pay

## 2021-09-29 ENCOUNTER — Other Ambulatory Visit: Payer: Self-pay

## 2021-09-29 MED ORDER — CHLORHEXIDINE GLUCONATE 0.12 % MT SOLN
OROMUCOSAL | 6 refills | Status: DC
  Filled 2021-09-29: qty 473, 14d supply, fill #0
  Filled 2022-03-22: qty 473, 14d supply, fill #1

## 2021-11-18 ENCOUNTER — Other Ambulatory Visit (HOSPITAL_COMMUNITY): Payer: Self-pay

## 2021-11-20 ENCOUNTER — Other Ambulatory Visit: Payer: Self-pay

## 2021-11-25 ENCOUNTER — Other Ambulatory Visit: Payer: Self-pay

## 2021-12-04 ENCOUNTER — Other Ambulatory Visit: Payer: Self-pay

## 2021-12-25 ENCOUNTER — Encounter: Payer: Self-pay | Admitting: Internal Medicine

## 2021-12-25 ENCOUNTER — Ambulatory Visit: Payer: Self-pay | Attending: Internal Medicine | Admitting: Internal Medicine

## 2021-12-25 ENCOUNTER — Other Ambulatory Visit: Payer: Self-pay

## 2021-12-25 VITALS — BP 117/77 | HR 60 | Temp 97.8°F | Wt 203.4 lb

## 2021-12-25 DIAGNOSIS — Z23 Encounter for immunization: Secondary | ICD-10-CM

## 2021-12-25 DIAGNOSIS — Z21 Asymptomatic human immunodeficiency virus [HIV] infection status: Secondary | ICD-10-CM

## 2021-12-25 DIAGNOSIS — E1159 Type 2 diabetes mellitus with other circulatory complications: Secondary | ICD-10-CM

## 2021-12-25 DIAGNOSIS — E785 Hyperlipidemia, unspecified: Secondary | ICD-10-CM

## 2021-12-25 DIAGNOSIS — E669 Obesity, unspecified: Secondary | ICD-10-CM

## 2021-12-25 DIAGNOSIS — E1169 Type 2 diabetes mellitus with other specified complication: Secondary | ICD-10-CM

## 2021-12-25 DIAGNOSIS — I152 Hypertension secondary to endocrine disorders: Secondary | ICD-10-CM

## 2021-12-25 LAB — HM DIABETES EYE EXAM

## 2021-12-25 LAB — POCT GLYCOSYLATED HEMOGLOBIN (HGB A1C)
HbA1c POC (<> result, manual entry): 7.3 % (ref 4.0–5.6)
HbA1c, POC (controlled diabetic range): 7.3 % — AB (ref 0.0–7.0)
HbA1c, POC (prediabetic range): 7.3 % — AB (ref 5.7–6.4)
Hemoglobin A1C: 7.3 % — AB (ref 4.0–5.6)

## 2021-12-25 LAB — GLUCOSE, POCT (MANUAL RESULT ENTRY): POC Glucose: 116 mg/dl — AB (ref 70–99)

## 2021-12-25 NOTE — Progress Notes (Signed)
? ? ?Patient ID: Jennifer Ford, female    DOB: 1960-12-14  MRN: [id number] ? ?CC: Follow-up (Pt is here for 4 month follow up visit. ) ? ? ?Subjective: ?Jennifer Ford is a 61 y.o. female who presents for chronic disease management. ?Her concerns today include:  ?Pt with hx of HTN, DM with micoralbumin/polyneuropathy, obesity, HL, HIV.  ? ?DM: ?DIABETES TYPE 2/Obesity ?Last A1C:   ?Results for orders placed or performed in visit on 12/25/21  ?POCT glycosylated hemoglobin (Hb A1C)  ?Result Value Ref Range  ? Hemoglobin A1C 7.3 (A) 4.0 - 5.6 %  ? HbA1c POC (<> result, manual entry) 7.3 4.0 - 5.6 %  ? HbA1c, POC (prediabetic range) 7.3 (A) 5.7 - 6.4 %  ? HbA1c, POC (controlled diabetic range) 7.3 (A) 0.0 - 7.0 %  ?POCT glucose (manual entry)  ?Result Value Ref Range  ? POC Glucose 116 (A) 70 - 99 mg/dl  ?A1C has improved from 8.1 on last visit  to 7.3 today ?Med Adherence:  [x]  Yes -Lantus 17 units daily and Metformin 1 gram BID ?Medication side effects:  []  Yes    [x]  No ?Home Monitoring?  [x]  Yes 3x/wk in a.m before BF ?Home glucose results range:130-154.  BS this a.m in office is 116 and she is fasting ?Diet Adherence:  gets off work late and ends up eating dinner 12 midnight.  Feels her eating habits are up and down.  Thinks she is emotional eater ?Exercise: states she does a lot of walking.  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 []  Yes    [x]  No ?Retinopathy hx? []  Yes    [x]  No ?Last eye exam: 12/04/2021 ?Comments: wgh up 4 lbs  since last visit ? ?HTN: Patient reports compliance with taking hydrochlorothiazide 25 mg daily and lisinopril 5 mg daily. ?She limits salt in the foods. ?No chest pains, shortness of breath, lower extremity edema. ? ?HL: Reports compliance with atorvastatin 20 mg.  Tolerating okay ? ?HIV:  saw her ID specialist 12/12/21 at [facility].  HIV RNA Levels were undetectable.  Compliant with med Biktarvy. ?-She  had recent labs done through the infectious disease specialist.  I was able to review those labs on care everywhere.  CBC was normal.  Electrolytes were okay.  GFR was greater than 90. ? ?She is requesting a letter to keep in her car or to take to the sheriff's office verifying the reason that she has all 4 of her car windows tinted.  Patient states that she takes care of her 45 year old mother who lives with her.  She takes her places with her and sometimes has to leave her in the car when she has to make a quick stop somewhere like to a convenience store.  She has all of her windows tinted so that no one sees her mother in the backseat or tries to call the police for her thinking that she is being mistreated because her mother tends to lean and cannot sit up straight in the car seat.  She was stopped by an Garment/textile technologist several weeks ago and gave him this reason.  He told her that she will need need to get some verification from the doctor so that if she is pulled over again it would not be a problem.     ? ?Pt with positive dep screen today.  Reports this is not major  issue for her. ? ?HM:  due for Tdapt and Zoster vaccines.  I see that her last Tdap was done in 2013 by looking at care everywhere. ?Patient Active Problem List  ? Diagnosis Date Noted  ? Class 2 severe obesity due to excess calories with serious comorbidity and body mass index (BMI) of 38.0 to 38.9 in adult [facility]) 02/29/2020  ? Microalbuminuria 02/29/2020  ? Positive depression screening 02/29/2020  ? Hyperlipidemia associated with type 2 diabetes mellitus ([place]) 02/29/2020  ? Hypertension associated with diabetes ([place]) 02/29/2020  ? Type 2 diabetes mellitus with diabetic polyneuropathy, without long-term current use of insulin ([place]) 02/29/2020  ? Asymptomatic HIV infection ([place]) 02/29/2020  ?  ? ?Current Outpatient Medications on File Prior to Visit  ?Medication Sig Dispense Refill  ? atorvastatin (LIPITOR) 20 MG tablet Take 1 tablet (20 mg total) by  mouth daily. Stop pravastatin 90 tablet 2  ? bictegravir-emtricitabine-tenofovir AF (BIKTARVY) 50-200-25 MG TABS tablet Take 1 tablet by mouth daily.    ? Blood Glucose Monitoring Suppl (TRUE METRIX METER) w/Device KIT Use to check blood sugar three times daily. Dx code E11.69 1 kit 0  ? chlorhexidine (PERIDEX) 0.12 % solution SWISH 1/2 OZ FOR 30 SECONDS AND SPIT AFTER EVENING MOUTH CARE. DO NOT RINSE 473 mL 6  ? cholecalciferol (VITAMIN D) 1000 UNITS tablet Take 1,000 Units by mouth daily.    ? glucose blood (TRUE METRIX BLOOD GLUCOSE TEST) test strip Use to check blood sugar three times daily. Dx code E11.69 100 each 12  ? hydrochlorothiazide (HYDRODIURIL) 25 MG tablet Take 1 tablet (25 mg total) by mouth daily. 90 tablet 2  ? insulin glargine (LANTUS SOLOSTAR) 100 UNIT/ML Solostar Pen Inject 17 Units into the skin at bedtime. 15 mL 2  ? Insulin Pen Needle (PEN NEEDLES) 31G X 8 MM MISC UAD 100 each 6  ? Insulin Pen Needle (TRUEPLUS 5-BEVEL PEN NEEDLES) 31G X 8 MM MISC See admin instructions. 100 each 3  ? lisinopril (ZESTRIL) 5 MG tablet Take 1 tablet (5 mg total) by mouth daily. 90 tablet 2  ? metFORMIN (GLUCOPHAGE) 1000 MG tablet Take 1 tablet (1,000 mg total) by mouth 2 (two) times daily with a meal. 180 tablet 2  ? Multiple Vitamin (MULTIVITAMIN WITH MINERALS) TABS tablet Take 1 tablet by mouth daily.    ? TRUEplus Lancets 28G MISC Use to check blood sugar three times daily. Dx code E11.69 100 each 4  ? ?No current facility-administered medications on file prior to visit.  ? ? ?Allergies  ?Allergen Reactions  ? Aspirin Nausea And Vomiting  ? ? ?Social History  ? ?Socioeconomic History  ? Marital status: Single  ?  Spouse name: Not on file  ? Number of children: 2  ? Years of education: Not on file  ? Highest education level: Not on file  ?Occupational History  ? Not on file  ?Tobacco Use  ? Smoking status: Never  ? Smokeless tobacco: Never  ?Vaping Use  ? Vaping Use: Never used  ?Substance and Sexual Activity   ? Alcohol use: Yes  ?  Comment: occasionally  ? Drug use: No  ? Sexual activity: Not on file  ?Other Topics Concern  ? Not on file  ?Social History Narrative  ? Not on file  ? ?Social Determinants of Health  ? ?Financial Resource Strain: Not on file  ?Food Insecurity: Not on file  ?Transportation Needs: Not on file  ?Physical Activity: Not on file  ?Stress: Not on  file  ?Social Connections: Not on file  ?Intimate Partner Violence: Not on file  ? ? ?Family History  ?Problem Relation Age of Onset  ? Diabetes Mother   ? Hypertension Mother   ? Hypertension Sister   ? Diabetes Maternal Grandmother   ? Diabetes Maternal Grandfather   ? ? ?Past Surgical History:  ?Procedure Laterality Date  ? ABDOMINAL HYSTERECTOMY    ? BREAST REDUCTION SURGERY Bilateral   ? ? ?ROS: ?Review of Systems ?Negative except as stated above ? ?PHYSICAL EXAM: ?BP 117/77 (BP Location: Left Arm, Patient Position: Sitting, Cuff Size: Normal)   Pulse 60   Temp 97.8 ?F (36.6 ?C)   Wt 203 lb 6.4 oz (92.3 kg)   SpO2 100%   BMI 37.20 kg/m?   ?Wt Readings from Last 3 Encounters:  ?12/25/21 203 lb 6.4 oz (92.3 kg)  ?08/22/21 199 lb 3.2 oz (90.4 kg)  ?11/28/20 205 lb 3.2 oz (93.1 kg)  ? ? ?Physical Exam ? ?General appearance - alert, well appearing, and in no distress ?Mental status - normal mood, behavior, speech, dress, motor activity, and thought processes ?Neck - supple, no significant adenopathy ?Chest - clear to auscultation, no wheezes, rales or rhonchi, symmetric air entry ?Heart - normal rate, regular rhythm, normal S1, S2, no murmurs, rubs, clicks or gallops ?Extremities - peripheral pulses normal, no pedal edema, no clubbing or cyanosis ? ? ?  12/25/2021  ?  9:03 AM 08/22/2021  ?  9:45 AM 11/28/2020  ?  8:48 AM  ?Depression screen PHQ 2/9  ?Decreased Interest 0 0 0  ?Down, Depressed, Hopeless 0 0   ?PHQ - 2 Score 0 0 0  ?Altered sleeping 1    ?Tired, decreased energy 1    ?Change in appetite 1    ?Feeling bad or failure about yourself  0     ?Trouble concentrating 0    ?Moving slowly or fidgety/restless 0    ?Suicidal thoughts 0    ?PHQ-9 Score 3    ? ? ? ?  Latest Ref Rng & Units 02/29/2020  ?  3:25 PM  ?CMP  ?Glucose 65 - 99 mg/dL 305    ?BUN 6 - 24

## 2021-12-25 NOTE — Patient Instructions (Signed)

## 2022-01-26 ENCOUNTER — Other Ambulatory Visit: Payer: Self-pay

## 2022-01-27 ENCOUNTER — Ambulatory Visit: Payer: Self-pay | Attending: Internal Medicine | Admitting: Pharmacist

## 2022-01-27 ENCOUNTER — Other Ambulatory Visit: Payer: Self-pay

## 2022-01-27 DIAGNOSIS — Z23 Encounter for immunization: Secondary | ICD-10-CM

## 2022-01-27 NOTE — Progress Notes (Signed)
Patient presents for vaccination against zoster per orders of Dr. Johnson. Consent given. Counseling provided. No contraindications exists. Vaccine administered without incident.   Luke Van Ausdall, PharmD, BCACP, CPP Clinical Pharmacist [facility] [phone]  

## 2022-03-23 ENCOUNTER — Other Ambulatory Visit: Payer: Self-pay

## 2022-03-23 ENCOUNTER — Other Ambulatory Visit (HOSPITAL_COMMUNITY): Payer: Self-pay

## 2022-03-24 ENCOUNTER — Other Ambulatory Visit (HOSPITAL_COMMUNITY): Payer: Self-pay

## 2022-03-24 ENCOUNTER — Other Ambulatory Visit: Payer: Self-pay

## 2022-04-08 ENCOUNTER — Other Ambulatory Visit: Payer: Self-pay

## 2022-04-30 ENCOUNTER — Other Ambulatory Visit: Payer: Self-pay

## 2022-04-30 ENCOUNTER — Ambulatory Visit: Payer: Self-pay | Attending: Internal Medicine | Admitting: Internal Medicine

## 2022-04-30 ENCOUNTER — Encounter: Payer: Self-pay | Admitting: Internal Medicine

## 2022-04-30 VITALS — BP 112/62 | HR 63 | Temp 98.0°F | Ht 62.0 in | Wt 204.0 lb

## 2022-04-30 DIAGNOSIS — E669 Obesity, unspecified: Secondary | ICD-10-CM

## 2022-04-30 DIAGNOSIS — I152 Hypertension secondary to endocrine disorders: Secondary | ICD-10-CM

## 2022-04-30 DIAGNOSIS — E1159 Type 2 diabetes mellitus with other circulatory complications: Secondary | ICD-10-CM

## 2022-04-30 DIAGNOSIS — E1169 Type 2 diabetes mellitus with other specified complication: Secondary | ICD-10-CM

## 2022-04-30 DIAGNOSIS — E785 Hyperlipidemia, unspecified: Secondary | ICD-10-CM

## 2022-04-30 DIAGNOSIS — Z23 Encounter for immunization: Secondary | ICD-10-CM

## 2022-04-30 LAB — POCT GLYCOSYLATED HEMOGLOBIN (HGB A1C): HbA1c, POC (controlled diabetic range): 7.6 % — AB (ref 0.0–7.0)

## 2022-04-30 LAB — GLUCOSE, POCT (MANUAL RESULT ENTRY): POC Glucose: 112 mg/dl — AB (ref 70–99)

## 2022-04-30 MED ORDER — EMPAGLIFLOZIN 10 MG PO TABS
10.0000 mg | ORAL_TABLET | Freq: Every day | ORAL | 4 refills | Status: DC
  Filled 2022-04-30: qty 14, 14d supply, fill #0

## 2022-04-30 MED ORDER — ZOSTER VAC RECOMB ADJUVANTED 50 MCG/0.5ML IM SUSR: 0.5000 mL | Freq: Once | INTRAMUSCULAR | 0 refills | Status: AC

## 2022-04-30 MED ORDER — ZOSTER VAC RECOMB ADJUVANTED 50 MCG/0.5ML IM SUSR
0.5000 mL | Freq: Once | INTRAMUSCULAR | 1 refills | Status: AC
  Filled 2022-05-12: qty 1, 1d supply, fill #0

## 2022-04-30 NOTE — Progress Notes (Signed)
Patient ID: Jennifer Ford, female    DOB: 02-24-1961  MRN: [id number]  CC: chronic ds management  Subjective: Jennifer Ford is a 61 y.o. female who presents for chronic ds management Her concerns today include:  Pt with hx of HTN, DM with micoralbumin/polyneuropathy, obesity, HL, HIV.   DM: Results for orders placed or performed in visit on 04/30/22  POCT glucose (manual entry)  Result Value Ref Range   POC Glucose 112 (A) 70 - 99 mg/dl  POCT glycosylated hemoglobin (Hb A1C)  Result Value Ref Range   Hemoglobin A1C     HbA1c POC (<> result, manual entry)     HbA1c, POC (prediabetic range)     HbA1c, POC (controlled diabetic range) 7.6 (A) 0.0 - 7.0 %  Compliant with Lantus 17 units daily and Metformin 1 gram BID Not checking BS regularly Drinks 2 regular [employer] a day.  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  Thinking about joining [employer] stable since last visit  HL:  Taking and tolerating Lipitor.  Last LDL 103.  HYPERTENSION Currently taking: see medication list.  Compliant with hydrochlorothiazide 25 mg daily and lisinopril 5 mg daily Med Adherence: _0  Yes    _1  No Medication side effects: _2  Yes    _3  No Adherence with salt restriction: _4  Yes    _5  No Home Monitoring?: _6  Yes    _7  No Monitoring Frequency:  Home BP results range:  SOB? _8  Yes    _9  No Chest Pain?: _10  Yes    _11  No Leg swelling?: _12  Yes    _13  No Headaches?: _14  Yes    _15  No Dizziness? _16  Yes    _17  No Comments:   HIV:  has appt with ID specialist next mth.  Taking Biktavy consistently  Patient Active Problem List   Diagnosis Date Noted   Class 2 severe obesity due to excess calories with serious comorbidity and body mass index (BMI) of 38.0 to 38.9 in adult [facility]) 02/29/2020   Microalbuminuria 02/29/2020   Positive depression screening 02/29/2020   Hyperlipidemia associated with type 2 diabetes mellitus ([place])  02/29/2020   Hypertension associated with diabetes ([place]) 02/29/2020   Type 2 diabetes mellitus with diabetic polyneuropathy, without long-term current use of insulin ([place]) 02/29/2020   Asymptomatic HIV infection ([place]) 02/29/2020     Current Outpatient Medications on File Prior to Visit  Medication Sig Dispense Refill   atorvastatin (LIPITOR) 20 MG tablet Take 1 tablet (20 mg total) by mouth daily. Stop pravastatin 90 tablet 2   bictegravir-emtricitabine-tenofovir AF (BIKTARVY) 50-200-25 MG TABS tablet Take 1 tablet by mouth daily.     Blood Glucose Monitoring Suppl (TRUE METRIX METER) w/Device Jennifer Use to check blood sugar three times daily. Dx code E11.69 1 Jennifer 0   chlorhexidine (PERIDEX) 0.12 % solution SWISH 1/2 OZ FOR 30 SECONDS AND SPIT AFTER EVENING MOUTH CARE. DO NOT RINSE 473 mL 6   cholecalciferol (VITAMIN D) 1000 UNITS tablet Take 1,000 Units by mouth daily.     glucose blood (TRUE METRIX BLOOD GLUCOSE TEST) test strip Use to check blood sugar three times daily. Dx code E11.69 100 each 12   hydrochlorothiazide (HYDRODIURIL) 25 MG tablet Take 1 tablet (25 mg total) by mouth daily. 90 tablet 2   insulin glargine (LANTUS SOLOSTAR) 100 UNIT/ML Solostar Pen Inject 17 Units into the skin at bedtime. 15 mL 2  Insulin Pen Needle (PEN NEEDLES) 31G X 8 MM MISC UAD 100 each 6   Insulin Pen Needle (TRUEPLUS 5-BEVEL PEN NEEDLES) 31G X 8 MM MISC See admin instructions. 100 each 3   lisinopril (ZESTRIL) 5 MG tablet Take 1 tablet (5 mg total) by mouth daily. 90 tablet 2   metFORMIN (GLUCOPHAGE) 1000 MG tablet Take 1 tablet (1,000 mg total) by mouth 2 (two) times daily with a meal. 180 tablet 2   Multiple Vitamin (MULTIVITAMIN WITH MINERALS) TABS tablet Take 1 tablet by mouth daily.     TRUEplus Lancets 28G MISC Use to check blood sugar three times daily. Dx code E11.69 100 each 4   No current facility-administered medications on file prior to visit.    Allergies  Allergen Reactions   Aspirin  Nausea And Vomiting    Social History   Socioeconomic History   Marital status: Single    Spouse name: Not on file   Number of children: 2   Years of education: Not on file   Highest education level: Not on file  Occupational History   Not on file  Tobacco Use   Smoking status: Never   Smokeless tobacco: Never  Vaping Use   Vaping Use: Never used  Substance and Sexual Activity   Alcohol use: Yes    Comment: occasionally   Drug use: No   Sexual activity: Not on file  Other Topics Concern   Not on file  Social History Narrative   Not on file   Social Determinants of Health   Financial Resource Strain: Not on file  Food Insecurity: Not on file  Transportation Needs: Not on file  Physical Activity: Not on file  Stress: Not on file  Social Connections: Not on file  Intimate Partner Violence: Not on file    Family History  Problem Relation Age of Onset   Diabetes Mother    Hypertension Mother    Hypertension Sister    Diabetes Maternal Grandmother    Diabetes Maternal Grandfather     Past Surgical History:  Procedure Laterality Date   ABDOMINAL HYSTERECTOMY     BREAST REDUCTION SURGERY Bilateral     ROS: Review of Systems Negative except as stated above  PHYSICAL EXAM: BP 112/62   Pulse 63   Temp 98 F (36.7 C)   Ht _0  (1.575 m)   Wt 204 lb (92.5 kg)   SpO2 97%   BMI 37.31 kg/m   Wt Readings from Last 3 Encounters:  04/30/22 204 lb (92.5 kg)  12/25/21 203 lb 6.4 oz (92.3 kg)  08/22/21 199 lb 3.2 oz (90.4 kg)    Physical Exam  General appearance - alert, well appearing, older African-American female and in no distress Mental status - normal mood, behavior, speech, dress, motor activity, and thought processes Neck - supple, no significant adenopathy Chest - clear to auscultation, no wheezes, rales or rhonchi, symmetric air entry Heart - normal rate, regular rhythm, normal S1, S2, no murmurs, rubs, clicks or gallops Extremities - peripheral  pulses normal, no pedal edema, no clubbing or cyanosis      Latest Ref Rng & Units 02/29/2020    3:25 PM  CMP  Glucose 65 - 99 mg/dL 305   BUN 6 - 24 mg/dL 13   Creatinine 0.57 - 1.00 mg/dL 0.69   Sodium 134 - 144 mmol/L 135   Potassium 3.5 - 5.2 mmol/L 3.9   Chloride 96 - 106 mmol/L 96   CO2 20 -  29 mmol/L 27   Calcium 8.7 - 10.2 mg/dL 9.5   Total Protein 6.0 - 8.5 g/dL 6.8   Total Bilirubin 0.0 - 1.2 mg/dL <0.2   Alkaline Phos 48 - 121 IU/L 93   AST 0 - 40 IU/L 13   ALT 0 - 32 IU/L 13    Lipid Panel     Component Value Date/Time   CHOL 157 04/01/2021 0901   TRIG 84 04/01/2021 0901   HDL 38 (L) 04/01/2021 0901   CHOLHDL 4.1 04/01/2021 0901   LDLCALC 103 (H) 04/01/2021 0901    CBC    Component Value Date/Time   WBC 8.3 02/29/2020 1525   RBC 4.73 02/29/2020 1525   HGB 14.0 02/29/2020 1525   HCT 43.1 02/29/2020 1525   PLT 323 02/29/2020 1525   MCV 91 02/29/2020 1525   MCH 29.6 02/29/2020 1525   MCHC 32.5 02/29/2020 1525   RDW 12.8 02/29/2020 1525    ASSESSMENT AND PLAN:  1. Type 2 diabetes mellitus with obesity ([place]) Not at goal. Dietary counseling given.  Encouraged her to eliminate sugary drinks in particular the sodas from her diet and trying to drink more water. Encouraged her to move more.  Currently active at work 2 days a week.  Challenged her to walk at least 2 days on her days off as well. Continue Lantus 17 units daily.  Continue metformin 1 g twice a day.  Add Jardiance.  Discussed the importance of staying well-hydrated while on this medication.  Advised to stop the medication if she has a develops any acute gastrointestinal illness that involves vomiting or diarrhea.  Resume after the event subsides. - POCT glucose (manual entry) - POCT glycosylated hemoglobin (Hb A1C) - Microalbumin / creatinine urine ratio - empagliflozin (JARDIANCE) 10 MG TABS tablet; Take 1 tablet (10 mg total) by mouth daily before breakfast.  Dispense: 30 tablet; Refill: 4 -  Comprehensive metabolic panel  2. Hypertension associated with diabetes ([place]) At goal.  Continue HCTZ and lisinopril at current doses.  3. Hyperlipidemia associated with type 2 diabetes mellitus (H[facility]) Continue atorvastatin. - Lipid panel  4. Need for zoster vaccination - Zoster Vaccine Adjuvanted [facility]) injection; Inject 0.5 mLs into the muscle once for 1 dose.  Dispense: 0.5 mL; Refill: 0  5. Need for immunization against influenza - Flu Vaccine QUAD 33moIM (Fluarix, Fluzone & Alfiuria Quad PF)    Patient was given the opportunity to ask questions.  Patient verbalized understanding of the plan and was able to repeat key elements of the plan.   This documentation was completed using DRadio producer  Any transcriptional errors are unintentional.  Orders Placed This Encounter  Procedures   Flu Vaccine QUAD 632moM (Fluarix, Fluzone & Alfiuria Quad PF)   Lipid panel   Microalbumin / creatinine urine ratio   Comprehensive metabolic panel   POCT glucose (manual entry)   POCT glycosylated hemoglobin (Hb A1C)     Requested Prescriptions   Signed Prescriptions Disp Refills   empagliflozin (JARDIANCE) 10 MG TABS tablet 30 tablet 4    Sig: Take 1 tablet (10 mg total) by mouth daily before breakfast.   Zoster Vaccine Adjuvanted (SHINGRIX) injection 0.5 mL 0    Sig: Inject 0.5 mLs into the muscle once for 1 dose.    Return in about 4 months (around 08/30/2022).  DeKarle PlumberMD, FACP

## 2022-04-30 NOTE — Patient Instructions (Signed)
Your A1c is 7.6.  The goal is to be less than 7.  We have added a low-dose of a diabetes pill called Jardiance 10 mg daily.  Continue current dose of Lantus insulin and metformin. Please cut back on the sugary drinks including the sodas.  Try to drink more water. Try to increase your exercise to at least 4 days a week for 30 minutes.

## 2022-05-01 ENCOUNTER — Telehealth: Payer: Self-pay

## 2022-05-01 ENCOUNTER — Other Ambulatory Visit: Payer: Self-pay | Admitting: Internal Medicine

## 2022-05-01 ENCOUNTER — Other Ambulatory Visit: Payer: Self-pay

## 2022-05-01 DIAGNOSIS — E1169 Type 2 diabetes mellitus with other specified complication: Secondary | ICD-10-CM

## 2022-05-01 MED ORDER — ATORVASTATIN CALCIUM 40 MG PO TABS
40.0000 mg | ORAL_TABLET | Freq: Every day | ORAL | 1 refills | Status: DC
  Filled 2022-05-01: qty 90, 90d supply, fill #0

## 2022-05-01 MED ORDER — POTASSIUM CHLORIDE ER 8 MEQ PO TBCR
8.0000 meq | EXTENDED_RELEASE_TABLET | Freq: Every day | ORAL | 0 refills | Status: DC
  Filled 2022-05-01: qty 90, 90d supply, fill #0

## 2022-05-01 NOTE — Progress Notes (Signed)
Let patient know that her cholesterol level has improved but still not at goal.  Level is 95 with goal being less than 70.  I recommend increasing atorvastatin from 20 mg daily to 40 mg daily.  Updated prescription sent to the pharmacy. -Kidney and liver function tests are good.  Potassium level mildly low.  Likely due to hydrochlorothiazide.  Recommend starting a low-dose of potassium supplement daily.  Prescription sent to the pharmacy.

## 2022-05-01 NOTE — Telephone Encounter (Signed)
Pt was called and is aware of results, DOB was confirmed.  ?

## 2022-05-01 NOTE — Telephone Encounter (Signed)
-----   Message from Guy Franco, RN sent at 05/01/2022  2:31 PM EDT -----  ----- Message ----- From: Marcine Matar, MD Sent: 05/01/2022   8:55 AM EDT To: Desmond Dike, CMA  Let patient know that her cholesterol level has improved but still not at goal.  Level is 95 with goal being less than 70.  I recommend increasing atorvastatin from 20 mg daily to 40 mg daily.  Updated prescription sent to the pharmacy. -Kidney and liver function tests are good.  Potassium level mildly low.  Likely due to hydrochlorothiazide.  Recommend starting a low-dose of potassium supplement daily.  Prescription sent to the pharmacy.

## 2022-05-02 LAB — COMPREHENSIVE METABOLIC PANEL
ALT: 12 IU/L (ref 0–32)
AST: 13 IU/L (ref 0–40)
Albumin/Globulin Ratio: 1.8 (ref 1.2–2.2)
Albumin: 4.3 g/dL (ref 3.8–4.9)
Alkaline Phosphatase: 92 IU/L (ref 44–121)
BUN/Creatinine Ratio: 17 (ref 12–28)
BUN: 11 mg/dL (ref 8–27)
Bilirubin Total: 0.3 mg/dL (ref 0.0–1.2)
CO2: 29 mmol/L (ref 20–29)
Calcium: 9.5 mg/dL (ref 8.7–10.3)
Chloride: 97 mmol/L (ref 96–106)
Creatinine, Ser: 0.64 mg/dL (ref 0.57–1.00)
Globulin, Total: 2.4 g/dL (ref 1.5–4.5)
Glucose: 87 mg/dL (ref 70–99)
Potassium: 3.4 mmol/L — ABNORMAL LOW (ref 3.5–5.2)
Sodium: 140 mmol/L (ref 134–144)
Total Protein: 6.7 g/dL (ref 6.0–8.5)
eGFR: 101 mL/min/{1.73_m2} (ref >59)

## 2022-05-02 LAB — LIPID PANEL
Chol/HDL Ratio: 4.1 ratio (ref 0.0–4.4)
Cholesterol, Total: 146 mg/dL (ref 100–199)
HDL: 36 mg/dL — ABNORMAL LOW (ref >39)
LDL Chol Calc (NIH): 95 mg/dL (ref 0–99)
Triglycerides: 78 mg/dL (ref 0–149)
VLDL Cholesterol Cal: 15 mg/dL (ref 5–40)

## 2022-05-02 LAB — MICROALBUMIN / CREATININE URINE RATIO
Creatinine, Urine: 79.7 mg/dL
Microalb/Creat Ratio: <4 mg/g creat (ref 0–29)
Microalbumin, Urine: <3 ug/mL

## 2022-05-04 ENCOUNTER — Other Ambulatory Visit: Payer: Self-pay

## 2022-05-05 ENCOUNTER — Other Ambulatory Visit: Payer: Self-pay

## 2022-05-06 ENCOUNTER — Other Ambulatory Visit: Payer: Self-pay

## 2022-05-07 ENCOUNTER — Other Ambulatory Visit: Payer: Self-pay

## 2022-05-08 ENCOUNTER — Other Ambulatory Visit: Payer: Self-pay

## 2022-05-12 ENCOUNTER — Other Ambulatory Visit: Payer: Self-pay

## 2022-05-13 ENCOUNTER — Other Ambulatory Visit: Payer: Self-pay

## 2022-05-26 ENCOUNTER — Other Ambulatory Visit: Payer: Self-pay

## 2022-06-08 ENCOUNTER — Other Ambulatory Visit: Payer: Self-pay

## 2022-06-12 ENCOUNTER — Other Ambulatory Visit: Payer: Self-pay

## 2022-06-18 ENCOUNTER — Other Ambulatory Visit: Payer: Self-pay

## 2022-07-15 ENCOUNTER — Telehealth: Payer: Self-pay | Admitting: Internal Medicine

## 2022-07-15 NOTE — Telephone Encounter (Signed)
Pt name and DOB verified. Patient aware of results and result note per Dr.Johnson.  

## 2022-07-16 ENCOUNTER — Other Ambulatory Visit: Payer: Self-pay | Admitting: Internal Medicine

## 2022-07-16 MED ORDER — TRUEPLUS 5-BEVEL PEN NEEDLES 31G X 8 MM MISC
1 refills | Status: DC
  Filled 2022-07-16: qty 100, 25d supply, fill #0
  Filled 2023-06-03: qty 100, 25d supply, fill #1

## 2022-07-17 ENCOUNTER — Other Ambulatory Visit: Payer: Self-pay

## 2022-07-20 ENCOUNTER — Other Ambulatory Visit: Payer: Self-pay

## 2022-07-30 ENCOUNTER — Other Ambulatory Visit: Payer: Self-pay

## 2022-07-31 ENCOUNTER — Other Ambulatory Visit: Payer: Self-pay | Admitting: Pharmacist

## 2022-07-31 ENCOUNTER — Other Ambulatory Visit: Payer: Self-pay

## 2022-07-31 DIAGNOSIS — E1169 Type 2 diabetes mellitus with other specified complication: Secondary | ICD-10-CM

## 2022-07-31 MED ORDER — EMPAGLIFLOZIN 10 MG PO TABS: 10.0000 mg | ORAL_TABLET | Freq: Every day | ORAL | 4 refills | Status: DC

## 2022-08-03 ENCOUNTER — Other Ambulatory Visit: Payer: Self-pay

## 2022-08-14 ENCOUNTER — Other Ambulatory Visit (HOSPITAL_BASED_OUTPATIENT_CLINIC_OR_DEPARTMENT_OTHER): Payer: Self-pay

## 2022-08-14 ENCOUNTER — Other Ambulatory Visit: Payer: Self-pay

## 2022-08-20 ENCOUNTER — Other Ambulatory Visit: Payer: Self-pay

## 2022-08-21 ENCOUNTER — Other Ambulatory Visit: Payer: Self-pay

## 2022-09-03 ENCOUNTER — Encounter: Payer: Self-pay | Admitting: Internal Medicine

## 2022-09-03 ENCOUNTER — Other Ambulatory Visit: Payer: Self-pay

## 2022-09-03 ENCOUNTER — Ambulatory Visit: Payer: Self-pay | Attending: Internal Medicine | Admitting: Internal Medicine

## 2022-09-03 VITALS — BP 112/68 | HR 68 | Temp 98.3°F | Ht 62.0 in | Wt 199.0 lb

## 2022-09-03 DIAGNOSIS — E785 Hyperlipidemia, unspecified: Secondary | ICD-10-CM

## 2022-09-03 DIAGNOSIS — Z21 Asymptomatic human immunodeficiency virus [HIV] infection status: Secondary | ICD-10-CM

## 2022-09-03 DIAGNOSIS — I152 Hypertension secondary to endocrine disorders: Secondary | ICD-10-CM

## 2022-09-03 DIAGNOSIS — E1159 Type 2 diabetes mellitus with other circulatory complications: Secondary | ICD-10-CM

## 2022-09-03 DIAGNOSIS — E669 Obesity, unspecified: Secondary | ICD-10-CM

## 2022-09-03 DIAGNOSIS — E1169 Type 2 diabetes mellitus with other specified complication: Secondary | ICD-10-CM

## 2022-09-03 LAB — POCT GLYCOSYLATED HEMOGLOBIN (HGB A1C): HbA1c, POC (controlled diabetic range): 7.1 % — AB (ref 0.0–7.0)

## 2022-09-03 LAB — GLUCOSE, POCT (MANUAL RESULT ENTRY): POC Glucose: 124 mg/dl — AB (ref 70–99)

## 2022-09-03 MED ORDER — LANTUS SOLOSTAR 100 UNIT/ML ~~LOC~~ SOPN
17.0000 [IU] | PEN_INJECTOR | Freq: Every day | SUBCUTANEOUS | 5 refills | Status: DC
  Filled 2022-09-03: qty 15, 88d supply, fill #0
  Filled 2023-06-03 (×2): qty 15, 88d supply, fill #1

## 2022-09-03 MED ORDER — METFORMIN HCL 1000 MG PO TABS
1000.0000 mg | ORAL_TABLET | Freq: Two times a day (BID) | ORAL | 2 refills | Status: DC
  Filled 2022-09-03: qty 180, 90d supply, fill #0

## 2022-09-03 MED ORDER — LISINOPRIL 5 MG PO TABS
5.0000 mg | ORAL_TABLET | Freq: Every day | ORAL | 2 refills | Status: DC
  Filled 2022-09-03: qty 90, 90d supply, fill #0

## 2022-09-03 MED ORDER — POTASSIUM CHLORIDE ER 8 MEQ PO TBCR
8.0000 meq | EXTENDED_RELEASE_TABLET | Freq: Every day | ORAL | 1 refills | Status: DC
  Filled 2022-09-03: qty 90, 90d supply, fill #0

## 2022-09-03 NOTE — Patient Instructions (Signed)
Try to cut back to just one [facility] a day. Change to wheat breat. Try to exercise at home by walking in place for 20-30 minutes several days a week.

## 2022-09-03 NOTE — Progress Notes (Signed)
Patient ID: Jennifer Ford, female    DOB: Feb 21, 1961  MRN: [id number]  CC: Diabetes (DM f/u. Med refills. Jeannine Boga received flu vax this season)   Subjective: Jennifer Ford is a 62 y.o. female who presents for chronic ds management Her concerns today include:  Pt with hx of HTN, DM with micoralbumin/polyneuropathy, obesity, HL, HIV.    DM:  Results for orders placed or performed in visit on 09/03/22  POCT glucose (manual entry)  Result Value Ref Range   POC Glucose 124 (A) 70 - 99 mg/dl  POCT glycosylated hemoglobin (Hb A1C)  Result Value Ref Range   Hemoglobin A1C     HbA1c POC (<> result, manual entry)     HbA1c, POC (prediabetic range)     HbA1c, POC (controlled diabetic range) 7.1 (A) 0.0 - 7.0 %  On last visit, Jardiance 10 mg daily was added to Lantus 17 units daily and metformin 1 g twice a day. Compliant with medications. Still drinking [employer] 2 cans a day.  Love bread; usually white bread Not getting in much exercise because she is afraid to leave mom alone who has dementia Weight is down 5 pounds since last visit.  Checks BS but not regularly.   HTN: BP low today on initial check.  No device to check BP.  No dizziness/HA/CP/LE edema.  Reports compliance with taking HCTZ 25 mg daily and lisinopril 5 mg daily.  Took meds already today. She limits salt in the foods.    HL: Reports compliance with taking atorvastatin. Last LDL had improved but was not at goal.  Level was 95.  Lipitor was increased to 40 mg  HIV:  last levels 05/2022 was undetectable.  Compliant with [facility]  HM:  due for COVID booster Patient Active Problem List   Diagnosis Date Noted   Microalbuminuria 02/29/2020   Positive depression screening 02/29/2020   Hyperlipidemia associated with type 2 diabetes mellitus (HCC) 02/29/2020   Hypertension associated with diabetes (HCC) 02/29/2020   Type 2 diabetes mellitus with diabetic polyneuropathy, without long-term current use of insulin (HCC) 02/29/2020    Asymptomatic HIV infection (HCC) 02/29/2020     Current Outpatient Medications on File Prior to Visit  Medication Sig Dispense Refill   atorvastatin (LIPITOR) 40 MG tablet Take 1 tablet (40 mg total) by mouth daily. 90 tablet 1   bictegravir-emtricitabine-tenofovir AF (BIKTARVY) 50-200-25 MG TABS tablet Take 1 tablet by mouth daily.     Blood Glucose Monitoring Suppl (TRUE METRIX METER) w/Device KIT Use to check blood sugar three times daily. Dx code E11.69 1 kit 0   chlorhexidine (PERIDEX) 0.12 % solution SWISH 1/2 OZ FOR 30 SECONDS AND SPIT AFTER EVENING MOUTH CARE. DO NOT RINSE 473 mL 6   cholecalciferol (VITAMIN D) 1000 UNITS tablet Take 1,000 Units by mouth daily.     empagliflozin (JARDIANCE) 10 MG TABS tablet Take 1 tablet (10 mg total) by mouth daily before breakfast. 30 tablet 4   glucose blood (TRUE METRIX BLOOD GLUCOSE TEST) test strip Use to check blood sugar three times daily. Dx code E11.69 100 each 12   hydrochlorothiazide (HYDRODIURIL) 25 MG tablet Take 1 tablet (25 mg total) by mouth daily. 90 tablet 2   Insulin Pen Needle (TRUEPLUS 5-BEVEL PEN NEEDLES) 31G X 8 MM MISC Use as instructed. 100 each 1   Multiple Vitamin (MULTIVITAMIN WITH MINERALS) TABS tablet Take 1 tablet by mouth daily.     TRUEplus Lancets 28G MISC Use to check blood sugar  three times daily. Dx code E11.69 100 each 4   No current facility-administered medications on file prior to visit.    Allergies  Allergen Reactions   Aspirin Nausea And Vomiting    Social History   Socioeconomic History   Marital status: Single    Spouse name: Not on file   Number of children: 2   Years of education: Not on file   Highest education level: Not on file  Occupational History   Not on file  Tobacco Use   Smoking status: Never   Smokeless tobacco: Never  Vaping Use   Vaping Use: Never used  Substance and Sexual Activity   Alcohol use: Yes    Comment: occasionally   Drug use: No   Sexual activity: Not on  file  Other Topics Concern   Not on file  Social History Narrative   Not on file   Social Determinants of Health   Financial Resource Strain: Not on file  Food Insecurity: Not on file  Transportation Needs: Not on file  Physical Activity: Not on file  Stress: Not on file  Social Connections: Not on file  Intimate Partner Violence: Not on file    Family History  Problem Relation Age of Onset   Diabetes Mother    Hypertension Mother    Hypertension Sister    Diabetes Maternal Grandmother    Diabetes Maternal Grandfather     Past Surgical History:  Procedure Laterality Date   ABDOMINAL HYSTERECTOMY     BREAST REDUCTION SURGERY Bilateral     ROS: Review of Systems Negative except as stated above  PHYSICAL EXAM: BP 112/68   Pulse 68   Temp 98.3 F (36.8 C) (Oral)   Ht 5\' 2"  (1.575 m)   Wt 199 lb (90.3 kg)   SpO2 97%   BMI 36.40 kg/m   Wt Readings from Last 3 Encounters:  09/03/22 199 lb (90.3 kg)  04/30/22 204 lb (92.5 kg)  12/25/21 203 lb 6.4 oz (92.3 kg)    Physical Exam  General appearance - alert, well appearing, older African-American female and in no distress Mental status - normal mood, behavior, speech, dress, motor activity, and thought processes Neck - supple, no significant adenopathy Chest - clear to auscultation, no wheezes, rales or rhonchi, symmetric air entry Heart - normal rate, regular rhythm, normal S1, S2, no murmurs, rubs, clicks or gallops Extremities - peripheral pulses normal, no pedal edema, no clubbing or cyanosis      Latest Ref Rng & Units 04/30/2022    9:54 AM 02/29/2020    3:25 PM  CMP  Glucose 70 - 99 mg/dL 87  305   BUN 8 - 27 mg/dL 11  13   Creatinine 0.57 - 1.00 mg/dL 0.64  0.69   Sodium 134 - 144 mmol/L 140  135   Potassium 3.5 - 5.2 mmol/L 3.4  3.9   Chloride 96 - 106 mmol/L 97  96   CO2 20 - 29 mmol/L 29  27   Calcium 8.7 - 10.3 mg/dL 9.5  9.5   Total Protein 6.0 - 8.5 g/dL 6.7  6.8   Total Bilirubin 0.0 - 1.2  mg/dL 0.3  <0.2   Alkaline Phos 44 - 121 IU/L 92  93   AST 0 - 40 IU/L 13  13   ALT 0 - 32 IU/L 12  13    Lipid Panel     Component Value Date/Time   CHOL 146 04/30/2022 0954   TRIG  78 04/30/2022 0954   HDL 36 (L) 04/30/2022 0954   CHOLHDL 4.1 04/30/2022 0954   LDLCALC 95 04/30/2022 0954    CBC    Component Value Date/Time   WBC 8.3 02/29/2020 1525   RBC 4.73 02/29/2020 1525   HGB 14.0 02/29/2020 1525   HCT 43.1 02/29/2020 1525   PLT 323 02/29/2020 1525   MCV 91 02/29/2020 1525   MCH 29.6 02/29/2020 1525   MCHC 32.5 02/29/2020 1525   RDW 12.8 02/29/2020 1525    ASSESSMENT AND PLAN:  1. Type 2 diabetes mellitus with obesity (HCC) A1c has improved and is close to goal. We agreed to continue dose of current medications including Lantus 17 units daily, metformin 1 g twice a day and Jardiance 10 mg daily.  Patient will work on improving eating habits more.  She has made a commitment to cutting down to no more than 1 [facility] a day and will change to a week bread.  We discussed ways she can exercise at home like walking in place for 20 to 30 minutes several days a week. - POCT glucose (manual entry) - POCT glycosylated hemoglobin (Hb A1C) - insulin glargine (LANTUS SOLOSTAR) 100 UNIT/ML Solostar Pen; Inject 17 Units into the skin at bedtime.  Dispense: 15 mL; Refill: 5 - metFORMIN (GLUCOPHAGE) 1000 MG tablet; Take 1 tablet (1,000 mg total) by mouth 2 (two) times daily with a meal.  Dispense: 180 tablet; Refill: 2  2. Hypertension associated with diabetes ([place]) At goal.  Continue lisinopril and HCTZ - lisinopril (ZESTRIL) 5 MG tablet; Take 1 tablet (5 mg total) by mouth daily.  Dispense: 90 tablet; Refill: 2 - potassium chloride (KLOR-CON) 8 MEQ tablet; Take 1 tablet (8 mEq total) by mouth daily.  Dispense: 90 tablet; Refill: 1  3. Hyperlipidemia associated with type 2 diabetes mellitus (HCC) Continue atorvastatin 40 mg daily.  LFTs checked about 3 weeks after dose of  Lipitor was increased showed normal levels.  4. Asymptomatic HIV infection ([place]) -Encouraged her to continue being compliant with her medication.  Keep follow-up appointment with her infectious disease specialist.    Patient was given the opportunity to ask questions.  Patient verbalized understanding of the plan and was able to repeat key elements of the plan.   This documentation was completed using Radio producer.  Any transcriptional errors are unintentional.  Orders Placed This Encounter  Procedures   POCT glucose (manual entry)   POCT glycosylated hemoglobin (Hb A1C)     Requested Prescriptions   Signed Prescriptions Disp Refills   insulin glargine (LANTUS SOLOSTAR) 100 UNIT/ML Solostar Pen 15 mL 5    Sig: Inject 17 Units into the skin at bedtime.   lisinopril (ZESTRIL) 5 MG tablet 90 tablet 2    Sig: Take 1 tablet (5 mg total) by mouth daily.   metFORMIN (GLUCOPHAGE) 1000 MG tablet 180 tablet 2    Sig: Take 1 tablet (1,000 mg total) by mouth 2 (two) times daily with a meal.   potassium chloride (KLOR-CON) 8 MEQ tablet 90 tablet 1    Sig: Take 1 tablet (8 mEq total) by mouth daily.    Return in about 4 months (around 01/02/2023).  Karle Plumber, MD, FACP

## 2022-09-08 ENCOUNTER — Other Ambulatory Visit: Payer: Self-pay

## 2022-10-19 ENCOUNTER — Other Ambulatory Visit: Payer: Self-pay

## 2022-10-22 ENCOUNTER — Other Ambulatory Visit: Payer: Self-pay

## 2022-11-02 ENCOUNTER — Other Ambulatory Visit: Payer: Self-pay

## 2022-11-23 ENCOUNTER — Other Ambulatory Visit: Payer: Self-pay

## 2022-11-30 ENCOUNTER — Other Ambulatory Visit: Payer: Self-pay

## 2022-12-23 ENCOUNTER — Emergency Department (HOSPITAL_COMMUNITY): Payer: No Typology Code available for payment source

## 2022-12-23 ENCOUNTER — Emergency Department (HOSPITAL_COMMUNITY)
Admission: EM | Admit: 2022-12-23 | Discharge: 2022-12-23 | Disposition: A | Discharge status: Home / Self Care | Payer: No Typology Code available for payment source | Attending: Emergency Medicine | Admitting: Emergency Medicine

## 2022-12-23 ENCOUNTER — Other Ambulatory Visit: Payer: Self-pay

## 2022-12-23 DIAGNOSIS — Y92410 Unspecified street and highway as the place of occurrence of the external cause: Secondary | ICD-10-CM | POA: Diagnosis not present

## 2022-12-23 DIAGNOSIS — Z79899 Other long term (current) drug therapy: Secondary | ICD-10-CM | POA: Insufficient documentation

## 2022-12-23 DIAGNOSIS — M545 Low back pain, unspecified: Secondary | ICD-10-CM | POA: Diagnosis present

## 2022-12-23 DIAGNOSIS — I10 Essential (primary) hypertension: Secondary | ICD-10-CM | POA: Insufficient documentation

## 2022-12-23 DIAGNOSIS — E119 Type 2 diabetes mellitus without complications: Secondary | ICD-10-CM | POA: Insufficient documentation

## 2022-12-23 DIAGNOSIS — Z21 Asymptomatic human immunodeficiency virus [HIV] infection status: Secondary | ICD-10-CM | POA: Diagnosis not present

## 2022-12-23 DIAGNOSIS — M25511 Pain in right shoulder: Secondary | ICD-10-CM | POA: Insufficient documentation

## 2022-12-23 DIAGNOSIS — Z7984 Long term (current) use of oral hypoglycemic drugs: Secondary | ICD-10-CM | POA: Insufficient documentation

## 2022-12-23 DIAGNOSIS — M62830 Muscle spasm of back: Secondary | ICD-10-CM | POA: Diagnosis not present

## 2022-12-23 MED ORDER — DIAZEPAM 2 MG PO TABS
2.0000 mg | ORAL_TABLET | Freq: Once | ORAL | Status: AC
  Administered 2022-12-23: 2 mg via ORAL
  Filled 2022-12-23: qty 1

## 2022-12-23 MED ORDER — PREDNISONE 10 MG PO TABS: 20.0000 mg | ORAL_TABLET | Freq: Every day | ORAL | 0 refills | Status: AC

## 2022-12-23 MED ORDER — CYCLOBENZAPRINE HCL 10 MG PO TABS: 10.0000 mg | ORAL_TABLET | Freq: Two times a day (BID) | ORAL | 0 refills | Status: DC | PRN
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
## 2022-12-23 NOTE — Discharge Instructions (Addendum)
Exam and imaging were reassuring, likely you have to strain the muscles within your back as well as your shoulder.  I have started you on steroids as well as a muscle relaxer please take as prescribed.  Steroids can make your blood sugar elevated please discontinue if they become too high.  You may use over-the-counter pain medication as needed.  I have also given you a prescription for a muscle relaxer this can make you drowsy do not consume alcohol or operate heavy machinery when taking this medication.   If Symptoms are not improving after a week's time please follow-up with your primary doctor for further assessment.   Come back to the emergency department if you develop chest pain, shortness of breath, severe abdominal pain, uncontrolled nausea, vomiting, diarrhea.

## 2022-12-23 NOTE — ED Notes (Signed)
Patient verbalizes understanding of discharge instructions. Opportunity for questioning and answers were provided. Armband removed by staff, pt discharged from ED. Ambulated out to lobby  
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
## 2023-01-04 ENCOUNTER — Ambulatory Visit: Payer: Self-pay | Attending: Internal Medicine | Admitting: Internal Medicine

## 2023-01-04 ENCOUNTER — Encounter: Payer: Self-pay | Admitting: Internal Medicine

## 2023-01-04 ENCOUNTER — Other Ambulatory Visit: Payer: Self-pay

## 2023-01-04 VITALS — BP 108/67 | HR 64 | Temp 98.2°F | Resp 16 | Wt 193.0 lb

## 2023-01-04 DIAGNOSIS — E1159 Type 2 diabetes mellitus with other circulatory complications: Secondary | ICD-10-CM

## 2023-01-04 DIAGNOSIS — Z1211 Encounter for screening for malignant neoplasm of colon: Secondary | ICD-10-CM

## 2023-01-04 DIAGNOSIS — Z21 Asymptomatic human immunodeficiency virus [HIV] infection status: Secondary | ICD-10-CM

## 2023-01-04 DIAGNOSIS — E1169 Type 2 diabetes mellitus with other specified complication: Secondary | ICD-10-CM

## 2023-01-04 DIAGNOSIS — E785 Hyperlipidemia, unspecified: Secondary | ICD-10-CM

## 2023-01-04 DIAGNOSIS — I152 Hypertension secondary to endocrine disorders: Secondary | ICD-10-CM

## 2023-01-04 DIAGNOSIS — E669 Obesity, unspecified: Secondary | ICD-10-CM

## 2023-01-04 DIAGNOSIS — Z7984 Long term (current) use of oral hypoglycemic drugs: Secondary | ICD-10-CM

## 2023-01-04 LAB — GLUCOSE, POCT (MANUAL RESULT ENTRY): POC Glucose: 154 mg/dl — AB (ref 70–99)

## 2023-01-04 LAB — POCT GLYCOSYLATED HEMOGLOBIN (HGB A1C): HbA1c, POC (prediabetic range): 6.4 % (ref 5.7–6.4)

## 2023-01-04 MED ORDER — ATORVASTATIN CALCIUM 40 MG PO TABS
40.0000 mg | ORAL_TABLET | Freq: Every day | ORAL | 1 refills | Status: DC
Start: 2023-01-04 — End: 2023-08-12
  Filled 2023-01-04: qty 90, 90d supply, fill #0
  Filled 2023-04-29: qty 90, 90d supply, fill #1

## 2023-01-04 MED ORDER — HYDROCHLOROTHIAZIDE 25 MG PO TABS
25.0000 mg | ORAL_TABLET | Freq: Every day | ORAL | 2 refills | Status: DC
Start: 2023-01-04 — End: 2023-08-12
  Filled 2023-01-04: qty 90, 90d supply, fill #0
  Filled 2023-04-29: qty 90, 90d supply, fill #1

## 2023-01-04 MED ORDER — POTASSIUM CHLORIDE ER 8 MEQ PO TBCR
8.0000 meq | EXTENDED_RELEASE_TABLET | Freq: Every day | ORAL | 1 refills | Status: DC
Start: 2023-01-04 — End: 2023-08-12
  Filled 2023-01-04: qty 90, 90d supply, fill #0
  Filled 2023-04-29: qty 90, 90d supply, fill #1

## 2023-01-04 MED ORDER — LISINOPRIL 5 MG PO TABS
5.0000 mg | ORAL_TABLET | Freq: Every day | ORAL | 2 refills | Status: DC
Start: 2023-01-04 — End: 2023-08-12
  Filled 2023-01-04: qty 90, 90d supply, fill #0
  Filled 2023-04-28: qty 90, 90d supply, fill #1

## 2023-01-04 MED ORDER — METFORMIN HCL 1000 MG PO TABS
1000.0000 mg | ORAL_TABLET | Freq: Two times a day (BID) | ORAL | 2 refills | Status: DC
Start: 2023-01-04 — End: 2023-08-12
  Filled 2023-01-04: qty 180, 90d supply, fill #0
  Filled 2023-04-29: qty 180, 90d supply, fill #1
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
## 2023-01-04 NOTE — Progress Notes (Signed)
Wants fasting labs this morning F/u DM  6.4

## 2023-04-28 ENCOUNTER — Other Ambulatory Visit: Payer: Self-pay

## 2023-04-29 ENCOUNTER — Other Ambulatory Visit: Payer: Self-pay | Admitting: Internal Medicine

## 2023-04-29 ENCOUNTER — Other Ambulatory Visit: Payer: Self-pay

## 2023-04-29 DIAGNOSIS — E1169 Type 2 diabetes mellitus with other specified complication: Secondary | ICD-10-CM

## 2023-04-29 MED ORDER — EMPAGLIFLOZIN 10 MG PO TABS
10.0000 mg | ORAL_TABLET | Freq: Every day | ORAL | 4 refills | Status: DC
Start: 2023-04-29 — End: 2023-08-12
  Filled 2023-04-29: qty 30, 30d supply, fill #0

## 2023-05-03 ENCOUNTER — Other Ambulatory Visit: Payer: Self-pay

## 2023-05-07 ENCOUNTER — Other Ambulatory Visit: Payer: Self-pay

## 2023-05-07 ENCOUNTER — Telehealth: Payer: Self-pay

## 2023-05-07 NOTE — Telephone Encounter (Signed)
Submitted application for [employer] to [employer]) for patient assistance.   Phone: [phone]

## 2023-05-24 ENCOUNTER — Other Ambulatory Visit: Payer: Self-pay

## 2023-06-01 ENCOUNTER — Other Ambulatory Visit: Payer: Self-pay

## 2023-06-03 ENCOUNTER — Other Ambulatory Visit: Payer: Self-pay

## 2023-06-07 ENCOUNTER — Other Ambulatory Visit: Payer: Self-pay

## 2023-06-16 ENCOUNTER — Other Ambulatory Visit: Payer: Self-pay

## 2023-06-17 ENCOUNTER — Other Ambulatory Visit: Payer: Self-pay

## 2023-07-09 ENCOUNTER — Telehealth: Payer: Self-pay | Admitting: Internal Medicine

## 2023-07-09 NOTE — Telephone Encounter (Signed)
Let patient know that I received her mammogram report that was done at [facility] this month.  Mammogram came back okay.  Due again in 1 year.

## 2023-07-12 NOTE — Telephone Encounter (Signed)
Called & spoke to the patient. Verified name & DOB. Informed of results. Patient expressed verbal understanding.

## 2023-07-23 LAB — COMPREHENSIVE METABOLIC PANEL WITH GFR: eGFR: >90

## 2023-07-26 ENCOUNTER — Other Ambulatory Visit: Payer: Self-pay

## 2023-08-12 ENCOUNTER — Other Ambulatory Visit: Payer: Self-pay

## 2023-08-12 ENCOUNTER — Ambulatory Visit: Payer: Self-pay | Attending: Internal Medicine | Admitting: Internal Medicine

## 2023-08-12 VITALS — BP 112/66 | HR 57 | Temp 98.0°F | Ht 62.0 in | Wt 203.0 lb

## 2023-08-12 DIAGNOSIS — Z7984 Long term (current) use of oral hypoglycemic drugs: Secondary | ICD-10-CM

## 2023-08-12 DIAGNOSIS — E1169 Type 2 diabetes mellitus with other specified complication: Secondary | ICD-10-CM

## 2023-08-12 DIAGNOSIS — E119 Type 2 diabetes mellitus without complications: Secondary | ICD-10-CM

## 2023-08-12 DIAGNOSIS — I152 Hypertension secondary to endocrine disorders: Secondary | ICD-10-CM

## 2023-08-12 DIAGNOSIS — Z1211 Encounter for screening for malignant neoplasm of colon: Secondary | ICD-10-CM

## 2023-08-12 DIAGNOSIS — Z794 Long term (current) use of insulin: Secondary | ICD-10-CM

## 2023-08-12 DIAGNOSIS — E1159 Type 2 diabetes mellitus with other circulatory complications: Secondary | ICD-10-CM

## 2023-08-12 DIAGNOSIS — E669 Obesity, unspecified: Secondary | ICD-10-CM

## 2023-08-12 DIAGNOSIS — E785 Hyperlipidemia, unspecified: Secondary | ICD-10-CM

## 2023-08-12 LAB — POCT GLYCOSYLATED HEMOGLOBIN (HGB A1C): HbA1c, POC (controlled diabetic range): 7.6 % — AB (ref 0.0–7.0)

## 2023-08-12 LAB — GLUCOSE, POCT (MANUAL RESULT ENTRY): POC Glucose: 120 mg/dL — AB (ref 70–99)

## 2023-08-12 MED ORDER — LANTUS SOLOSTAR 100 UNIT/ML ~~LOC~~ SOPN
17.0000 [IU] | PEN_INJECTOR | Freq: Every day | SUBCUTANEOUS | 5 refills | Status: DC
  Filled 2023-08-12: qty 15, 88d supply, fill #0
  Filled 2023-10-25: qty 6, 35d supply, fill #0
  Filled 2024-02-09: qty 6, 35d supply, fill #1

## 2023-08-12 MED ORDER — LISINOPRIL 5 MG PO TABS
5.0000 mg | ORAL_TABLET | Freq: Every day | ORAL | 2 refills | Status: DC
  Filled 2023-08-12: qty 90, 90d supply, fill #0
  Filled 2023-10-04 – 2023-10-05 (×2): qty 90, 90d supply, fill #1

## 2023-08-12 MED ORDER — ATORVASTATIN CALCIUM 40 MG PO TABS
40.0000 mg | ORAL_TABLET | Freq: Every day | ORAL | 1 refills | Status: DC
Start: 2023-08-12 — End: 2023-12-16
  Filled 2023-08-12: qty 90, 90d supply, fill #0
  Filled 2023-10-04 – 2023-10-05 (×2): qty 90, 90d supply, fill #1

## 2023-08-12 MED ORDER — EMPAGLIFLOZIN 10 MG PO TABS
10.0000 mg | ORAL_TABLET | Freq: Every day | ORAL | 1 refills | Status: DC
Start: 2023-08-12 — End: 2023-12-16
  Filled 2023-08-12: qty 90, 90d supply, fill #0

## 2023-08-12 MED ORDER — METFORMIN HCL 1000 MG PO TABS
1000.0000 mg | ORAL_TABLET | Freq: Two times a day (BID) | ORAL | 2 refills | Status: DC
  Filled 2023-08-12: qty 180, 90d supply, fill #0

## 2023-08-12 MED ORDER — POTASSIUM CHLORIDE ER 8 MEQ PO TBCR
8.0000 meq | EXTENDED_RELEASE_TABLET | Freq: Every day | ORAL | 1 refills | Status: DC
  Filled 2023-08-12: qty 90, 90d supply, fill #0

## 2023-08-12 MED ORDER — HYDROCHLOROTHIAZIDE 25 MG PO TABS
25.0000 mg | ORAL_TABLET | Freq: Every day | ORAL | 2 refills | Status: DC
Start: 2023-08-12 — End: 2023-12-16
  Filled 2023-08-12: qty 90, 90d supply, fill #0

## 2023-08-12 NOTE — Progress Notes (Signed)
Patient ID: Jennifer Ford, female    DOB: 09-14-1960  MRN: [id number]  CC: Diabetes (DM f/u. Med refills. /No questions / concerns/Already received flu vax. )   Subjective: Jennifer Ford is a 62 y.o. female who presents for chronic ds management. Her concerns today include:  Pt with hx of HTN, DM with micoralbumin/polyneuropathy, obesity, HL, HIV.    Discussed the use of AI scribe software for clinical note transcription with the patient, who gave verbal consent to proceed.  History of Present Illness   The patient, with a history of diabetes, hypertension, hyperlipidemia, and HIV, presents for a routine follow-up. DM: Results for orders placed or performed in visit on 08/12/23  POCT glucose (manual entry)   Collection Time: 08/12/23 11:30 AM  Result Value Ref Range   POC Glucose 120 (A) 70 - 99 mg/dl  POCT glycosylated hemoglobin (Hb A1C)   Collection Time: 08/12/23 11:36 AM  Result Value Ref Range   Hemoglobin A1C     HbA1c POC (<> result, manual entry)     HbA1c, POC (prediabetic range)     HbA1c, POC (controlled diabetic range) 7.6 (A) 0.0 - 7.0 %  She has been checking her blood glucose levels more regularly, often in the evenings, and reports readings of 95-98. However, she admits to recent dietary indiscretions, including increased consumption of sodas and bread, which she believes has negatively impacted her diabetes control. She has noticed a weight gain of 4 pounds since her last visit in May. She has been stretching out her Jardiance medication due to difficulties in obtaining it, sometimes taking it every other day to avoid running out. She has been out of Jardiance for about a month.  -Reports compliance with Lantus insulin 17 units daily and metformin 1 g twice a day.  HTN: Reports compliance with taking HCTZ 25 mg daily and lisinopril 5 mg daily. She reports no chest pains or shortness of breath, and no swelling in the legs.   HL: Reports compliance with  atorvastatin 40 mg daily.  HIV: Continues to follow with her infectious disease specialist through [facility].  Last seen earlier this month.  Reports compliance with taking Biktarvy.  HM: She has received her flu shot in October at her workplace. She has not had a diabetes eye exam due to lack of insurance and is overdue for colon cancer screening.  Given fit test on last visit.  She has misplaced the kit.      Patient Active Problem List   Diagnosis Date Noted   Microalbuminuria 02/29/2020   Positive depression screening 02/29/2020   Hyperlipidemia associated with type 2 diabetes mellitus (HCC) 02/29/2020   Hypertension associated with diabetes (HCC) 02/29/2020   Type 2 diabetes mellitus with diabetic polyneuropathy, without long-term current use of insulin (HCC) 02/29/2020   Asymptomatic HIV infection (HCC) 02/29/2020     Current Outpatient Medications on File Prior to Visit  Medication Sig Dispense Refill   bictegravir-emtricitabine-tenofovir AF (BIKTARVY) 50-200-25 MG TABS tablet Take 1 tablet by mouth daily.     Blood Glucose Monitoring Suppl (TRUE METRIX METER) w/Device KIT Use to check blood sugar three times daily. Dx code E11.69 1 kit 0   cholecalciferol (VITAMIN D) 1000 UNITS tablet Take 1,000 Units by mouth daily.     cyclobenzaprine (FLEXERIL) 10 MG tablet Take 1 tablet (10 mg total) by mouth 2 (two) times daily as needed for muscle spasms. 20 tablet 0   glucose blood (TRUE METRIX BLOOD GLUCOSE TEST) test  strip Use to check blood sugar three times daily. Dx code E11.69 100 each 12   Insulin Pen Needle (TRUEPLUS 5-BEVEL PEN NEEDLES) 31G X 8 MM MISC Use as instructed. 100 each 1   Multiple Vitamin (MULTIVITAMIN WITH MINERALS) TABS tablet Take 1 tablet by mouth daily.     TRUEplus Lancets 28G MISC Use to check blood sugar three times daily. Dx code E11.69 100 each 4   No current facility-administered medications on file prior to visit.    Allergies  Allergen Reactions    Aspirin Nausea And Vomiting    Social History   Socioeconomic History   Marital status: Single    Spouse name: Not on file   Number of children: 2   Years of education: Not on file   Highest education level: Not on file  Occupational History   Not on file  Tobacco Use   Smoking status: Never   Smokeless tobacco: Never  Vaping Use   Vaping status: Never Used  Substance and Sexual Activity   Alcohol use: Yes    Comment: occasionally   Drug use: No   Sexual activity: Not on file  Other Topics Concern   Not on file  Social History Narrative   Not on file   Social Drivers of Health   Financial Resource Strain: Low Risk  (08/12/2023)   Overall Financial Resource Strain (CARDIA)    Difficulty of Paying Living Expenses: Not hard at all  Food Insecurity: No Food Insecurity (08/12/2023)   Hunger Vital Sign    Worried About Running Out of Food in the Last Year: Never true    Ran Out of Food in the Last Year: Never true  Transportation Needs: No Transportation Needs (08/12/2023)   PRAPARE - Administrator, Civil Service (Medical): No    Lack of Transportation (Non-Medical): No  Physical Activity: Insufficiently Active (08/12/2023)   Exercise Vital Sign    Days of Exercise per Week: 2 days    Minutes of Exercise per Session: 20 min  Stress: Stress Concern Present (08/12/2023)   [employer] of Occupational Health - Occupational Stress Questionnaire    Feeling of Stress : Rather much  Social Connections: Moderately Integrated (08/12/2023)   Social Connection and Isolation Panel [NHANES]    Frequency of Communication with Friends and Family: More than three times a week    Frequency of Social Gatherings with Friends and Family: Three times a week    Attends Religious Services: 1 to 4 times per year    Active Member of Clubs or Organizations: Yes    Attends Banker Meetings: 1 to 4 times per year    Marital Status: Never married  Intimate Partner  Violence: Not At Risk (08/12/2023)   Humiliation, Afraid, Rape, and Kick questionnaire    Fear of Current or Ex-Partner: No    Emotionally Abused: No    Physically Abused: No    Sexually Abused: No    Family History  Problem Relation Age of Onset   Diabetes Mother    Hypertension Mother    Hypertension Sister    Diabetes Maternal Grandmother    Diabetes Maternal Grandfather     Past Surgical History:  Procedure Laterality Date   ABDOMINAL HYSTERECTOMY     BREAST REDUCTION SURGERY Bilateral     ROS: Review of Systems Negative except as stated above  PHYSICAL EXAM: BP 112/66 (BP Location: Left Arm, Patient Position: Sitting, Cuff Size: Normal)   Pulse (!) 57  Temp 98 F (36.7 C) (Oral)   Ht 5\' 2"  (1.575 m)   Wt 203 lb (92.1 kg)   SpO2 98%   BMI 37.13 kg/m   Wt Readings from Last 3 Encounters:  08/12/23 203 lb (92.1 kg)  01/04/23 193 lb (87.5 kg)  12/23/22 199 lb 1.2 oz (90.3 kg)    Physical Exam  General appearance - alert, well appearing, and in no distress Mental status - normal mood, behavior, speech, dress, motor activity, and thought processes Neck - supple, no significant adenopathy Chest - clear to auscultation, no wheezes, rales or rhonchi, symmetric air entry Heart - normal rate, regular rhythm, normal S1, S2, no murmurs, rubs, clicks or gallops Extremities - peripheral pulses normal, no pedal edema, no clubbing or cyanosis   Results for orders placed or performed in visit on 08/12/23  POCT glucose (manual entry)   Collection Time: 08/12/23 11:30 AM  Result Value Ref Range   POC Glucose 120 (A) 70 - 99 mg/dl  POCT glycosylated hemoglobin (Hb A1C)   Collection Time: 08/12/23 11:36 AM  Result Value Ref Range   Hemoglobin A1C     HbA1c POC (<> result, manual entry)     HbA1c, POC (prediabetic range)     HbA1c, POC (controlled diabetic range) 7.6 (A) 0.0 - 7.0 %       Latest Ref Rng & Units 04/30/2022    9:54 AM 02/29/2020    3:25 PM  CMP   Glucose 70 - 99 mg/dL 87  409   BUN 8 - 27 mg/dL 11  13   Creatinine 8.11 - 1.00 mg/dL 9.14  7.82   Sodium 956 - 144 mmol/L 140  135   Potassium 3.5 - 5.2 mmol/L 3.4  3.9   Chloride 96 - 106 mmol/L 97  96   CO2 20 - 29 mmol/L 29  27   Calcium 8.7 - 10.3 mg/dL 9.5  9.5   Total Protein 6.0 - 8.5 g/dL 6.7  6.8   Total Bilirubin 0.0 - 1.2 mg/dL 0.3  <2.1   Alkaline Phos 44 - 121 IU/L 92  93   AST 0 - 40 IU/L 13  13   ALT 0 - 32 IU/L 12  13    Lipid Panel     Component Value Date/Time   CHOL 146 04/30/2022 0954   TRIG 78 04/30/2022 0954   HDL 36 (L) 04/30/2022 0954   CHOLHDL 4.1 04/30/2022 0954   LDLCALC 95 04/30/2022 0954    CBC    Component Value Date/Time   WBC 8.3 02/29/2020 1525   RBC 4.73 02/29/2020 1525   HGB 14.0 02/29/2020 1525   HCT 43.1 02/29/2020 1525   PLT 323 02/29/2020 1525   MCV 91 02/29/2020 1525   MCH 29.6 02/29/2020 1525   MCHC 32.5 02/29/2020 1525   RDW 12.8 02/29/2020 1525    ASSESSMENT AND PLAN: 1. Type 2 diabetes mellitus with obesity (HCC) (Primary) Not at goal.  She has been taking Jardiance every other day instead of every day and has been out of it for 1 month.  Updated prescription sent to our pharmacy.  She probably has to sign up for it through the patient assistance program.  Continue Lantus insulin 17 units daily and metformin 1 g twice a day. Discussed and encourage healthy eating habits.  Teacher, English as a foreign language given. - POCT glycosylated hemoglobin (Hb A1C) - POCT glucose (manual entry) - empagliflozin (JARDIANCE) 10 MG TABS tablet; Take 1 tablet (10 mg total)  by mouth daily before breakfast.  Dispense: 90 tablet; Refill: 1 - insulin glargine (LANTUS SOLOSTAR) 100 UNIT/ML Solostar Pen; Inject 17 Units into the skin at bedtime.  Dispense: 15 mL; Refill: 5 - metFORMIN (GLUCOPHAGE) 1000 MG tablet; Take 1 tablet (1,000 mg total) by mouth 2 (two) times daily with a meal.  Dispense: 180 tablet; Refill: 2 - Microalbumin / creatinine urine ratio  2.  Insulin long-term use (HCC) See above  3. Diabetes mellitus treated with oral medication (HCC) See above  4. Hyperlipidemia associated with type 2 diabetes mellitus (HCC) Continue atorvastatin - atorvastatin (LIPITOR) 40 MG tablet; Take 1 tablet (40 mg total) by mouth daily.  Dispense: 90 tablet; Refill: 1  5. Hypertension associated with diabetes (HCC) At goal.  Continue HCTZ, potassium supplement and lisinopril.  Patient had recent chemistry done through [facility] the results of which I can see on Care Everywhere - hydrochlorothiazide (HYDRODIURIL) 25 MG tablet; Take 1 tablet (25 mg total) by mouth daily.  Dispense: 90 tablet; Refill: 2 - potassium chloride (KLOR-CON) 8 MEQ tablet; Take 1 tablet (8 mEq total) by mouth daily.  Dispense: 90 tablet; Refill: 1 - lisinopril (ZESTRIL) 5 MG tablet; Take 1 tablet (5 mg total) by mouth daily.  Dispense: 90 tablet; Refill: 2  6. Screening for colon cancer Given fit test.    Patient was given the opportunity to ask questions.  Patient verbalized understanding of the plan and was able to repeat key elements of the plan.   This documentation was completed using Paediatric nurse.  Any transcriptional errors are unintentional.  Orders Placed This Encounter  Procedures   Fecal occult blood, imunochemical([facility])   Microalbumin / creatinine urine ratio   POCT glycosylated hemoglobin (Hb A1C)   POCT glucose (manual entry)     Requested Prescriptions   Signed Prescriptions Disp Refills   atorvastatin (LIPITOR) 40 MG tablet 90 tablet 1    Sig: Take 1 tablet (40 mg total) by mouth daily.   hydrochlorothiazide (HYDRODIURIL) 25 MG tablet 90 tablet 2    Sig: Take 1 tablet (25 mg total) by mouth daily.   empagliflozin (JARDIANCE) 10 MG TABS tablet 90 tablet 1    Sig: Take 1 tablet (10 mg total) by mouth daily before breakfast.   insulin glargine (LANTUS SOLOSTAR) 100 UNIT/ML Solostar Pen 15 mL 5    Sig:  Inject 17 Units into the skin at bedtime.   metFORMIN (GLUCOPHAGE) 1000 MG tablet 180 tablet 2    Sig: Take 1 tablet (1,000 mg total) by mouth 2 (two) times daily with a meal.   potassium chloride (KLOR-CON) 8 MEQ tablet 90 tablet 1    Sig: Take 1 tablet (8 mEq total) by mouth daily.   lisinopril (ZESTRIL) 5 MG tablet 90 tablet 2    Sig: Take 1 tablet (5 mg total) by mouth daily.    Return in about 4 months (around 12/11/2023).  Jonah Blue, MD, FACP

## 2023-08-12 NOTE — Patient Instructions (Signed)

## 2023-08-13 ENCOUNTER — Other Ambulatory Visit: Payer: Self-pay

## 2023-08-13 LAB — MICROALBUMIN / CREATININE URINE RATIO
Creatinine, Urine: 196.1 mg/dL
Microalb/Creat Ratio: 7 mg/g{creat} (ref 0–29)
Microalbumin, Urine: 13.3 ug/mL

## 2023-09-23 ENCOUNTER — Other Ambulatory Visit: Payer: Self-pay

## 2023-09-24 ENCOUNTER — Other Ambulatory Visit: Payer: Self-pay

## 2023-09-27 ENCOUNTER — Other Ambulatory Visit: Payer: Self-pay

## 2023-10-04 ENCOUNTER — Other Ambulatory Visit (HOSPITAL_COMMUNITY): Payer: Self-pay

## 2023-10-05 ENCOUNTER — Other Ambulatory Visit: Payer: Self-pay

## 2023-10-26 ENCOUNTER — Other Ambulatory Visit: Payer: Self-pay

## 2023-10-28 LAB — HM DIABETES EYE EXAM

## 2023-11-02 ENCOUNTER — Other Ambulatory Visit: Payer: Self-pay

## 2023-11-03 ENCOUNTER — Other Ambulatory Visit: Payer: Self-pay

## 2023-11-08 ENCOUNTER — Encounter: Payer: Self-pay | Admitting: Internal Medicine

## 2023-11-08 DIAGNOSIS — E113292 Type 2 diabetes mellitus with mild nonproliferative diabetic retinopathy without macular edema, left eye: Secondary | ICD-10-CM | POA: Insufficient documentation

## 2023-11-18 ENCOUNTER — Other Ambulatory Visit: Payer: Self-pay

## 2023-12-14 ENCOUNTER — Ambulatory Visit: Payer: Self-pay | Admitting: Internal Medicine

## 2023-12-16 ENCOUNTER — Other Ambulatory Visit: Payer: Self-pay

## 2023-12-16 ENCOUNTER — Encounter: Payer: Self-pay | Admitting: Internal Medicine

## 2023-12-16 ENCOUNTER — Ambulatory Visit: Payer: Self-pay | Attending: Internal Medicine | Admitting: Internal Medicine

## 2023-12-16 ENCOUNTER — Ambulatory Visit: Payer: Self-pay | Admitting: Internal Medicine

## 2023-12-16 VITALS — BP 114/70 | HR 80 | Temp 98.2°F | Ht 62.0 in | Wt 198.0 lb

## 2023-12-16 DIAGNOSIS — I152 Hypertension secondary to endocrine disorders: Secondary | ICD-10-CM | POA: Insufficient documentation

## 2023-12-16 DIAGNOSIS — Z79899 Other long term (current) drug therapy: Secondary | ICD-10-CM | POA: Insufficient documentation

## 2023-12-16 DIAGNOSIS — E669 Obesity, unspecified: Secondary | ICD-10-CM | POA: Insufficient documentation

## 2023-12-16 DIAGNOSIS — I10 Essential (primary) hypertension: Secondary | ICD-10-CM

## 2023-12-16 DIAGNOSIS — Z21 Asymptomatic human immunodeficiency virus [HIV] infection status: Secondary | ICD-10-CM

## 2023-12-16 DIAGNOSIS — E785 Hyperlipidemia, unspecified: Secondary | ICD-10-CM | POA: Insufficient documentation

## 2023-12-16 DIAGNOSIS — Z794 Long term (current) use of insulin: Secondary | ICD-10-CM | POA: Insufficient documentation

## 2023-12-16 DIAGNOSIS — E1169 Type 2 diabetes mellitus with other specified complication: Secondary | ICD-10-CM

## 2023-12-16 DIAGNOSIS — Z7984 Long term (current) use of oral hypoglycemic drugs: Secondary | ICD-10-CM | POA: Insufficient documentation

## 2023-12-16 DIAGNOSIS — Z6836 Body mass index (BMI) 36.0-36.9, adult: Secondary | ICD-10-CM | POA: Insufficient documentation

## 2023-12-16 DIAGNOSIS — E1140 Type 2 diabetes mellitus with diabetic neuropathy, unspecified: Secondary | ICD-10-CM | POA: Insufficient documentation

## 2023-12-16 DIAGNOSIS — E1159 Type 2 diabetes mellitus with other circulatory complications: Secondary | ICD-10-CM | POA: Insufficient documentation

## 2023-12-16 DIAGNOSIS — B20 Human immunodeficiency virus [HIV] disease: Secondary | ICD-10-CM | POA: Insufficient documentation

## 2023-12-16 DIAGNOSIS — Z1211 Encounter for screening for malignant neoplasm of colon: Secondary | ICD-10-CM

## 2023-12-16 DIAGNOSIS — E119 Type 2 diabetes mellitus without complications: Secondary | ICD-10-CM

## 2023-12-16 LAB — GLUCOSE, POCT (MANUAL RESULT ENTRY): POC Glucose: 216 mg/dL — AB (ref 70–99)

## 2023-12-16 LAB — POCT GLYCOSYLATED HEMOGLOBIN (HGB A1C): HbA1c, POC (controlled diabetic range): 7.1 % — AB (ref 0.0–7.0)

## 2023-12-16 MED ORDER — LISINOPRIL 5 MG PO TABS
5.0000 mg | ORAL_TABLET | Freq: Every day | ORAL | 2 refills | Status: DC
  Filled 2023-12-16 – 2024-07-19 (×2): qty 90, 90d supply, fill #0

## 2023-12-16 MED ORDER — METFORMIN HCL 1000 MG PO TABS
1000.0000 mg | ORAL_TABLET | Freq: Two times a day (BID) | ORAL | 2 refills | Status: DC
  Filled 2023-12-16: qty 180, 90d supply, fill #0

## 2023-12-16 MED ORDER — HYDROCHLOROTHIAZIDE 25 MG PO TABS
25.0000 mg | ORAL_TABLET | Freq: Every day | ORAL | 2 refills | Status: DC
  Filled 2023-12-16: qty 90, 90d supply, fill #0

## 2023-12-16 MED ORDER — POTASSIUM CHLORIDE ER 8 MEQ PO TBCR
8.0000 meq | EXTENDED_RELEASE_TABLET | Freq: Every day | ORAL | 1 refills | Status: DC
  Filled 2023-12-16: qty 90, 90d supply, fill #0

## 2023-12-16 MED ORDER — ATORVASTATIN CALCIUM 40 MG PO TABS
40.0000 mg | ORAL_TABLET | Freq: Every day | ORAL | 1 refills | Status: DC
  Filled 2023-12-16: qty 90, 90d supply, fill #0

## 2023-12-16 MED ORDER — EMPAGLIFLOZIN 25 MG PO TABS
25.0000 mg | ORAL_TABLET | Freq: Every day | ORAL | 1 refills | Status: DC
  Filled 2023-12-16: qty 90, 90d supply, fill #0

## 2023-12-16 NOTE — Progress Notes (Signed)
 Patient ID: Jennifer Ford, female    DOB: 1961-01-19  MRN: [id number]  CC: Diabetes (DM & HTN f/u. Med refill. /No questions/ concerns/)   Subjective: Jennifer Ford is a 63 y.o. female who presents for chronic ds management. Her concerns today include:  Pt with hx of HTN, DM with micoralbumin/polyneuropathy, obesity, HL, HIV.    Discussed the use of AI scribe software for clinical note transcription with the patient, who gave verbal consent to proceed.  History of Present Illness Jennifer Ford is a 63 year old female with hypertension, hyperlipidemia, and diabetes who presents for a four-month follow-up visit.  HTN:  She manages hypertension with hydrochlorothiazide  25 mg daily and lisinopril  5 mg daily, adhering to a low-salt diet. She denies chest pain, shortness of breath, or leg swelling.  HL: For hyperlipidemia, she takes atorvastatin  40 mg daily without experiencing muscle aches or cramps.  DM: Results for orders placed or performed in visit on 12/16/23  POCT glucose (manual entry)   Collection Time: 12/16/23  3:39 PM  Result Value Ref Range   POC Glucose 216 (A) 70 - 99 mg/dl  POCT glycosylated hemoglobin (Hb A1C)   Collection Time: 12/16/23  3:48 PM  Result Value Ref Range   Hemoglobin A1C     HbA1c POC (<> result, manual entry)     HbA1c, POC (prediabetic range)     HbA1c, POC (controlled diabetic range) 7.1 (A) 0.0 - 7.0 %  Diabetes management includes Jardiance  10 mg daily and Lantus  insulin  at 17 units.  Reports compliance with taking her medications.  Her A1c has improved from 7.6% to 7.1% over four months. Blood sugar levels, monitored two to three times a week, range from 117 to 174 mg/dL. She works part-time and often has nurses at work check her blood sugar. She acknowledges poor eating habits, often opting for fast food due to the challenge of cooking for two, but is attempting to reduce intake.  She walks frequently but less than desired due to caregiving  responsibilities for her mother. 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  HIV: She continues to be followed by infectious disease at [facility]  Last seen in December.  Reports compliance with taking the Biktarvy.  HM: Still has FIT kit at home.  She promises to use it and bring it in.    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 90 tablet 1   bictegravir-emtricitabine-tenofovir AF (BIKTARVY) 50-200-25 MG TABS tablet Take 1 tablet by mouth daily.     Blood Glucose Monitoring Suppl (TRUE METRIX METER) w/Device KIT Use to check blood sugar three times daily. Dx code E11.69 1 kit 0   cholecalciferol (VITAMIN D) 1000 UNITS tablet Take 1,000 Units by mouth daily.     cyclobenzaprine  (FLEXERIL ) 10 MG tablet Take 1 tablet (10 mg total) by mouth 2 (two) times daily as needed  for muscle spasms. 20 tablet 0   empagliflozin  (JARDIANCE ) 10 MG TABS tablet Take 1 tablet (10 mg total) by mouth daily before breakfast. 90 tablet 1   glucose blood (TRUE METRIX BLOOD GLUCOSE TEST) test strip Use to check blood sugar three times daily. Dx code E11.69 100 each 12   hydrochlorothiazide  (HYDRODIURIL ) 25 MG tablet Take 1 tablet (25 mg total) by mouth daily. 90 tablet 2   insulin  glargine (LANTUS  SOLOSTAR) 100 UNIT/ML Solostar Pen Inject 17 Units into the skin at  bedtime. 15 mL 5   Insulin  Pen Needle (TRUEPLUS 5-BEVEL PEN NEEDLES) 31G X 8 MM MISC Use as instructed. 100 each 1   lisinopril  (ZESTRIL ) 5 MG tablet Take 1 tablet (5 mg total) by mouth daily. 90 tablet 2   metFORMIN  (GLUCOPHAGE ) 1000 MG tablet Take 1 tablet (1,000 mg total) by mouth 2 (two) times daily with a meal. 180 tablet 2   Multiple Vitamin (MULTIVITAMIN WITH MINERALS) TABS tablet Take 1 tablet by mouth daily.     potassium chloride  (KLOR-CON ) 8 MEQ tablet Take 1 tablet (8 mEq total) by mouth daily. 90 tablet 1   TRUEplus Lancets 28G MISC Use to check blood sugar three times daily. Dx code E11.69 100 each 4   No current facility-administered medications on file prior to visit.    Allergies  Allergen Reactions   Aspirin Nausea And Vomiting    Social History   Socioeconomic History   Marital status: Single    Spouse name: Not on file   Number of children: 2   Years of education: Not on file   Highest education level: Not on file  Occupational History   Not on file  Tobacco Use   Smoking status: Never   Smokeless tobacco: Never  Vaping Use   Vaping status: Never Used  Substance and Sexual Activity   Alcohol use: Yes    Comment: occasionally   Drug use: No   Sexual activity: Not on file  Other Topics Concern   Not on file  Social History Narrative   Not on file   Social Drivers of Health   Financial Resource Strain: Low Risk  (08/12/2023)   Overall Financial Resource Strain (CARDIA)    Difficulty of Paying Living Expenses: Not hard at all  Food Insecurity: No Food Insecurity (08/12/2023)   Hunger Vital Sign    Worried About Running Out of Food in the Last Year: Never true    Ran Out of Food in the Last Year: Never true  Transportation Needs: No Transportation Needs (08/12/2023)   PRAPARE - Administrator, Civil Service (Medical): No    Lack of Transportation (Non-Medical): No  Physical Activity: Insufficiently Active (08/12/2023)   Exercise Vital  Sign    Days of Exercise per Week: 2 days    Minutes of Exercise per Session: 20 min  Stress: Stress Concern Present (08/12/2023)   [employer] of Occupational Health - Occupational Stress Questionnaire    Feeling of Stress : Rather much  Social Connections: Moderately Integrated (08/12/2023)   Social Connection and Isolation Panel [NHANES]    Frequency of Communication with Friends and Family: More than three times a week    Frequency of Social Gatherings with Friends and Family: Three times a week    Attends Religious Services: 1 to 4 times per year    Active Member of Clubs or Organizations: Yes    Attends Banker Meetings: 1 to 4 times per year  Marital Status: Never married  Intimate Partner Violence: Not At Risk (08/12/2023)   Humiliation, Afraid, Rape, and Kick questionnaire    Fear of Current or Ex-Partner: No    Emotionally Abused: No    Physically Abused: No    Sexually Abused: No    Family History  Problem Relation Age of Onset   Diabetes Mother    Hypertension Mother    Hypertension Sister    Diabetes Maternal Grandmother    Diabetes Maternal Grandfather     Past Surgical History:  Procedure Laterality Date   ABDOMINAL HYSTERECTOMY     BREAST REDUCTION SURGERY Bilateral     ROS: Review of Systems Negative except as stated above  PHYSICAL EXAM: BP 114/70 (BP Location: Left Arm, Patient Position: Sitting, Cuff Size: Normal)   Pulse 80   Temp 98.2 F (36.8 C) (Oral)   Ht 5\' 2"  (1.575 m)   Wt 198 lb (89.8 kg)   SpO2 98%   BMI 36.21 kg/m   Wt Readings from Last 3 Encounters:  12/16/23 198 lb (89.8 kg)  08/12/23 203 lb (92.1 kg)  01/04/23 193 lb (87.5 kg)    Physical Exam  General appearance - alert, well appearing, and in no distress Mental status - normal mood, behavior, speech, dress, motor activity, and thought processes Neck - supple, no significant adenopathy Chest - clear to auscultation, no wheezes, rales or rhonchi,  symmetric air entry Heart - normal rate, regular rhythm, normal S1, S2, no murmurs, rubs, clicks or gallops Extremities - peripheral pulses normal, no pedal edema, no clubbing or cyanosis      Latest Ref Rng & Units 04/30/2022    9:54 AM 02/29/2020    3:25 PM  CMP  Glucose 70 - 99 mg/dL 87  413   BUN 8 - 27 mg/dL 11  13   Creatinine 2.44 - 1.00 mg/dL 0.10  2.72   Sodium 536 - 144 mmol/L 140  135   Potassium 3.5 - 5.2 mmol/L 3.4  3.9   Chloride 96 - 106 mmol/L 97  96   CO2 20 - 29 mmol/L 29  27   Calcium  8.7 - 10.3 mg/dL 9.5  9.5   Total Protein 6.0 - 8.5 g/dL 6.7  6.8   Total Bilirubin 0.0 - 1.2 mg/dL 0.3  <6.4   Alkaline Phos 44 - 121 IU/L 92  93   AST 0 - 40 IU/L 13  13   ALT 0 - 32 IU/L 12  13    Lipid Panel     Component Value Date/Time   CHOL 146 04/30/2022 0954   TRIG 78 04/30/2022 0954   HDL 36 (L) 04/30/2022 0954   CHOLHDL 4.1 04/30/2022 0954   LDLCALC 95 04/30/2022 0954    CBC    Component Value Date/Time   WBC 8.3 02/29/2020 1525   RBC 4.73 02/29/2020 1525   HGB 14.0 02/29/2020 1525   HCT 43.1 02/29/2020 1525   PLT 323 02/29/2020 1525   MCV 91 02/29/2020 1525   MCH 29.6 02/29/2020 1525   MCHC 32.5 02/29/2020 1525   RDW 12.8 02/29/2020 1525    ASSESSMENT AND PLAN: 1. Type 2 diabetes mellitus with obesity (HCC) (Primary) A1c close to goal.  Increase Jardiance  to 25 mg daily.  Advised that she will experience increased urination with the dose change.  Told to stay hydrated by drinking 4 to 8 glasses of water daily. Dietary counseling given.  Encourage better meal planning.  Encouraged her to do  some exercise at home like walking in place since she is not able to get out much because she is the primary caregiver for her mother. - POCT glycosylated hemoglobin (Hb A1C) - POCT glucose (manual entry) - metFORMIN  (GLUCOPHAGE ) 1000 MG tablet; Take 1 tablet (1,000 mg total) by mouth 2 (two) times daily with a meal.  Dispense: 180 tablet; Refill: 2 - empagliflozin   (JARDIANCE ) 25 MG TABS tablet; Take 1 tablet (25 mg total) by mouth daily before breakfast.  Dispense: 90 tablet; Refill: 1  2. Insulin  long-term use (HCC) 3. Diabetes mellitus treated with oral medication (HCC) See #1 above  4. Hyperlipidemia associated with type 2 diabetes mellitus (HCC) - atorvastatin  (LIPITOR) 40 MG tablet; Take 1 tablet (40 mg total) by mouth daily.  Dispense: 90 tablet; Refill: 1  5. Hypertension associated with diabetes (HCC) At goal.  Continue lisinopril  and hydrochlorothiazide  - hydrochlorothiazide  (HYDRODIURIL ) 25 MG tablet; Take 1 tablet (25 mg total) by mouth daily.  Dispense: 90 tablet; Refill: 2 - lisinopril  (ZESTRIL ) 5 MG tablet; Take 1 tablet (5 mg total) by mouth daily.  Dispense: 90 tablet; Refill: 2 - potassium chloride  (KLOR-CON ) 8 MEQ tablet; Take 1 tablet (8 mEq total) by mouth daily.  Dispense: 90 tablet; Refill: 1  6. Asymptomatic HIV infection (HCC) Followed by ID.  Continue Biktarvy  7. Screening for colon cancer Strongly encouraged her to use the fit test and turn it in for colon cancer screening.  She promises to do so.     Patient was given the opportunity to ask questions.  Patient verbalized understanding of the plan and was able to repeat key elements of the plan.   This documentation was completed using Paediatric nurse.  Any transcriptional errors are unintentional.  Orders Placed This Encounter  Procedures   POCT glycosylated hemoglobin (Hb A1C)   POCT glucose (manual entry)     Requested Prescriptions   Pending Prescriptions Disp Refills   atorvastatin  (LIPITOR) 40 MG tablet 90 tablet 1    Sig: Take 1 tablet (40 mg total) by mouth daily.   hydrochlorothiazide  (HYDRODIURIL ) 25 MG tablet 90 tablet 2    Sig: Take 1 tablet (25 mg total) by mouth daily.   lisinopril  (ZESTRIL ) 5 MG tablet 90 tablet 2    Sig: Take 1 tablet (5 mg total) by mouth daily.   metFORMIN  (GLUCOPHAGE ) 1000 MG tablet 180 tablet 2     Sig: Take 1 tablet (1,000 mg total) by mouth 2 (two) times daily with a meal.   potassium chloride  (KLOR-CON ) 8 MEQ tablet 90 tablet 1    Sig: Take 1 tablet (8 mEq total) by mouth daily.    No follow-ups on file.  Concetta Dee, MD, FACP

## 2023-12-16 NOTE — Patient Instructions (Signed)
 VISIT SUMMARY:  During your visit today, we reviewed your management of hypertension, hyperlipidemia, and diabetes. Your blood pressure and cholesterol levels are well-controlled, and your blood sugar levels have improved. We discussed your current medications, dietary habits, physical activity, and the importance of completing your colon cancer screening.  YOUR PLAN:  -TYPE 2 DIABETES MELLITUS: Type 2 diabetes is a condition where your body does not use insulin  properly, leading to high blood sugar levels. Your A1c has improved to 7.1%, and your blood sugar levels are between 117 and 174 mg/dL. We will increase your Jardiance  dose to 25 mg daily and continue your Lantus  insulin  at 17 units daily and Metformin . Please stay hydrated with the increase dose of Jardiance , plan healthier meals, and continue your physical activities like walking and using stairs.  -HYPERTENSION: Hypertension, or high blood pressure, is well-controlled with your current medications. Your blood pressure today is 114/70 mmHg. Continue taking hydrochlorothiazide  25 mg daily and lisinopril  5 mg daily, and keep limiting your salt intake.  -HYPERLIPIDEMIA: Hyperlipidemia is a condition with high levels of fats (lipids) in your blood. Your cholesterol levels are well-managed with atorvastatin  40 mg daily. Continue taking this medication as prescribed.  -GENERAL HEALTH MAINTENANCE: We discussed the importance of colon cancer screening. You have a FIT kit at home that you need to complete. This test helps detect early signs of colon cancer.  INSTRUCTIONS:  Please follow up with the infectious disease specialist in May or June as scheduled. Complete the FIT kit for colon cancer screening as soon as possible.

## 2023-12-17 ENCOUNTER — Other Ambulatory Visit: Payer: Self-pay

## 2023-12-23 ENCOUNTER — Other Ambulatory Visit: Payer: Self-pay

## 2024-01-13 ENCOUNTER — Other Ambulatory Visit (HOSPITAL_COMMUNITY): Payer: Self-pay

## 2024-01-20 ENCOUNTER — Ambulatory Visit: Payer: Self-pay

## 2024-02-10 ENCOUNTER — Other Ambulatory Visit: Payer: Self-pay

## 2024-02-14 ENCOUNTER — Other Ambulatory Visit: Payer: Self-pay

## 2024-03-13 ENCOUNTER — Other Ambulatory Visit: Payer: Self-pay

## 2024-03-15 ENCOUNTER — Telehealth: Payer: Self-pay | Admitting: Internal Medicine

## 2024-03-15 DIAGNOSIS — E1169 Type 2 diabetes mellitus with other specified complication: Secondary | ICD-10-CM

## 2024-03-15 MED ORDER — EMPAGLIFLOZIN 25 MG PO TABS: 25.0000 mg | ORAL_TABLET | Freq: Every day | ORAL | 1 refills | Status: DC

## 2024-03-15 NOTE — Telephone Encounter (Signed)
-----   Message from Burnard KANDICE Lot sent at 03/13/2024 10:53 AM EDT ----- Can the Jardiance  be resent to [facility] in [place], [place]? Patient receives this via the patient assistance program dispensed through this pharmacy.

## 2024-03-20 ENCOUNTER — Other Ambulatory Visit: Payer: Self-pay

## 2024-04-19 ENCOUNTER — Telehealth: Payer: Self-pay | Admitting: Internal Medicine

## 2024-04-19 NOTE — Telephone Encounter (Signed)
 Patient confirmed appointment for 04/20/2024, through volunteer call.

## 2024-04-20 ENCOUNTER — Ambulatory Visit: Payer: Self-pay | Attending: Internal Medicine | Admitting: Internal Medicine

## 2024-04-20 ENCOUNTER — Encounter: Payer: Self-pay | Admitting: Internal Medicine

## 2024-04-20 ENCOUNTER — Other Ambulatory Visit: Payer: Self-pay

## 2024-04-20 VITALS — BP 107/69 | HR 62 | Ht 62.0 in | Wt 195.0 lb

## 2024-04-20 DIAGNOSIS — Z1231 Encounter for screening mammogram for malignant neoplasm of breast: Secondary | ICD-10-CM

## 2024-04-20 DIAGNOSIS — Z789 Other specified health status: Secondary | ICD-10-CM

## 2024-04-20 DIAGNOSIS — E669 Obesity, unspecified: Secondary | ICD-10-CM

## 2024-04-20 DIAGNOSIS — E785 Hyperlipidemia, unspecified: Secondary | ICD-10-CM

## 2024-04-20 DIAGNOSIS — Z7984 Long term (current) use of oral hypoglycemic drugs: Secondary | ICD-10-CM

## 2024-04-20 DIAGNOSIS — E1159 Type 2 diabetes mellitus with other circulatory complications: Secondary | ICD-10-CM

## 2024-04-20 DIAGNOSIS — E119 Type 2 diabetes mellitus without complications: Secondary | ICD-10-CM

## 2024-04-20 DIAGNOSIS — I152 Hypertension secondary to endocrine disorders: Secondary | ICD-10-CM

## 2024-04-20 DIAGNOSIS — Z794 Long term (current) use of insulin: Secondary | ICD-10-CM

## 2024-04-20 DIAGNOSIS — E1169 Type 2 diabetes mellitus with other specified complication: Secondary | ICD-10-CM

## 2024-04-20 DIAGNOSIS — M79644 Pain in right finger(s): Secondary | ICD-10-CM

## 2024-04-20 DIAGNOSIS — Z23 Encounter for immunization: Secondary | ICD-10-CM

## 2024-04-20 LAB — POCT GLYCOSYLATED HEMOGLOBIN (HGB A1C): HbA1c, POC (controlled diabetic range): 7 % (ref 0.0–7.0)

## 2024-04-20 LAB — GLUCOSE, POCT (MANUAL RESULT ENTRY): POC Glucose: 115 mg/dL — AB (ref 70–99)

## 2024-04-20 NOTE — Progress Notes (Signed)
 Patient ID: Jennifer Ford, female    DOB: [birth year]  MRN: [id number]  CC: Diabetes (Dm f/u. [facility] patient assistance program application form completion/Finger pain on R hand X2 weeks - trouble bending / sore/Yes to flu vax)   Subjective: Jennifer Ford is a 63 y.o. female who presents for chronic ds management. Her concerns today include:  Pt with hx of HTN, DM with micoralbumin/polyneuropathy, obesity, HL, HIV.    Discussed the use of AI scribe software for clinical note transcription with the patient, who gave verbal consent to proceed.  History of Present Illness Jennifer Ford is a 63 year old female with diabetes, hypertension, and hyperlipidemia who presents for a follow-up visit.  DM:  Results for orders placed or performed in visit on 04/20/24  POCT glucose (manual entry)   Collection Time: 04/20/24  9:22 AM  Result Value Ref Range   POC Glucose 115 (A) 70 - 99 mg/dl  POCT glycosylated hemoglobin (Hb A1C)   Collection Time: 04/20/24  9:26 AM  Result Value Ref Range   Hemoglobin A1C     HbA1c POC (<> result, manual entry)     HbA1c, POC (prediabetic range)     HbA1c, POC (controlled diabetic range) 7.0 0.0 - 7.0 %   Diabetes management is ongoing with a current HbA1c of 7.0%, slightly improved from 7.1% four months ago and 7.6% prior to that. She is on metformin  1000 mg twice daily and Jardiance  (there is a discrepancy in the dosage received, still receiving 10 mg instead of the prescribed 25 mg) and Lantus  insulin  17 units daily.  Blood sugar monitoring is irregular, with recent readings of 115, 84, 118, and 94. She consumes sodas and bread, but often discards most of the soda. Her weight has decreased by 8 pounds since December. Physical activity includes walking and taking stairs at work, but she finds it challenging to exercise at home due to caregiving responsibilities for her mom.  HTN: She takes hydrochlorothiazide  daily and lisinopril  5 mg daily. She limits salt  intake. No chest pain or shortness of breath.  HL: she continues atorvastatin  40 mg daily.  She is HIV positive and continues to see her infectious disease specialist at [facility]. Her viral load remains undetectable as of blood test done in July 2025, and she is adherent to her Biktarvy regimen.  She reports soreness and difficulty bending her right index finger for a couple of weeks. No injury or swelling, but a history of braiding hair may have contributed to the discomfort.    YF:[facility] has FIT kit and has not turned in. 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 90 tablet 1   bictegravir-emtricitabine-tenofovir AF (BIKTARVY) 50-200-25 MG TABS tablet Take 1 tablet by mouth daily.     Blood Glucose Monitoring Suppl (TRUE METRIX METER)  w/Device KIT Use to check blood sugar three times daily. Dx code E11.69 1 kit 0   cholecalciferol (VITAMIN D) 1000 UNITS tablet Take 1,000 Units by mouth daily.     cyclobenzaprine  (FLEXERIL ) 10 MG tablet Take 1 tablet (10 mg total) by mouth 2 (two) times daily as needed for muscle spasms. 20 tablet 0   empagliflozin  (JARDIANCE ) 25 MG TABS tablet Take 1 tablet (25 mg total) by mouth daily before breakfast. 90 tablet 1   glucose blood (TRUE METRIX BLOOD GLUCOSE  TEST) test strip Use to check blood sugar three times daily. Dx code E11.69 100 each 12   hydrochlorothiazide  (HYDRODIURIL ) 25 MG tablet Take 1 tablet (25 mg total) by mouth daily. 90 tablet 2   insulin  glargine (LANTUS  SOLOSTAR) 100 UNIT/ML Solostar Pen Inject 17 Units into the skin at bedtime. 15 mL 5   Insulin  Pen Needle (TRUEPLUS 5-BEVEL PEN NEEDLES) 31G X 8 MM MISC Use as instructed. 100 each 1   lisinopril  (ZESTRIL ) 5 MG tablet Take 1 tablet (5 mg total) by mouth daily. 90 tablet 2   metFORMIN  (GLUCOPHAGE ) 1000 MG tablet Take 1 tablet (1,000 mg total) by mouth 2 (two) times daily with a meal. 180 tablet 2   Multiple Vitamin (MULTIVITAMIN WITH MINERALS) TABS tablet Take 1 tablet by mouth daily.     potassium chloride  (KLOR-CON ) 8 MEQ tablet Take 1 tablet (8 mEq total) by mouth daily. 90 tablet 1   TRUEplus Lancets 28G MISC Use to check blood sugar three times daily. Dx code E11.69 100 each 4   No current facility-administered medications on file prior to visit.    Allergies  Allergen Reactions   Aspirin Nausea And Vomiting    Social History   Socioeconomic History   Marital status: Single    Spouse name: Not on file   Number of children: 2   Years of education: Not on file   Highest education level: Not on file  Occupational History   Not on file  Tobacco Use   Smoking status: Never   Smokeless tobacco: Never  Vaping Use   Vaping status: Never Used  Substance and Sexual Activity   Alcohol use: Yes    Comment: occasionally   Drug use: No   Sexual activity: Not on file  Other Topics Concern   Not on file  Social History Narrative   Not on file   Social Drivers of Health   Financial Resource Strain: Low Risk  (08/12/2023)   Overall Financial Resource Strain (CARDIA)    Difficulty of Paying Living Expenses: Not hard at all  Food Insecurity: No Food Insecurity (08/12/2023)   Hunger Vital Sign    Worried About Running Out of Food in the Last Year: Never true    Ran  Out of Food in the Last Year: Never true  Transportation Needs: No Transportation Needs (08/12/2023)   PRAPARE - Administrator, Civil Service (Medical): No    Lack of Transportation (Non-Medical): No  Physical Activity: Insufficiently Active (08/12/2023)   Exercise Vital Sign    Days of Exercise per Week: 2 days    Minutes of Exercise per Session: 20 min  Stress: Stress Concern Present (08/12/2023)   [employer] of Occupational Health - Occupational Stress Questionnaire    Feeling of Stress : Rather much  Social Connections: Moderately Integrated (08/12/2023)   Social Connection and Isolation Panel    Frequency of Communication with Friends and Family: More than three times a week  Frequency of Social Gatherings with Friends and Family: Three times a week    Attends Religious Services: 1 to 4 times per year    Active Member of Clubs or Organizations: Yes    Attends Banker Meetings: 1 to 4 times per year    Marital Status: Never married  Intimate Partner Violence: Not At Risk (08/12/2023)   Humiliation, Afraid, Rape, and Kick questionnaire    Fear of Current or Ex-Partner: No    Emotionally Abused: No    Physically Abused: No    Sexually Abused: No    Family History  Problem Relation Age of Onset   Diabetes Mother    Hypertension Mother    Hypertension Sister    Diabetes Maternal Grandmother    Diabetes Maternal Grandfather     Past Surgical History:  Procedure Laterality Date   ABDOMINAL HYSTERECTOMY     BREAST REDUCTION SURGERY Bilateral     ROS: Review of Systems Negative except as stated above  PHYSICAL EXAM: BP 107/69 (BP Location: Left Arm, Patient Position: Sitting, Cuff Size: Normal)   Pulse 62   Ht 5' 2 (1.575 m)   Wt 195 lb (88.5 kg)   SpO2 98%   BMI 35.67 kg/m   Wt Readings from Last 3 Encounters:  04/20/24 195 lb (88.5 kg)  12/16/23 198 lb (89.8 kg)  08/12/23 203 lb (92.1 kg)    Physical Exam  General  appearance - alert, well appearing, and in no distress Mental status - normal mood, behavior, speech, dress, motor activity, and thought processes Chest - clear to auscultation, no wheezes, rales or rhonchi, symmetric air entry Heart - normal rate, regular rhythm, normal S1, S2, no murmurs, rubs, clicks or gallops Musculoskeletal -right hand: Index finger looks inflamed with soft tissue swelling.  No erythema.  Finger is tight.  She has good range of motion at the PIP and DIP jts.  No fluctuance.  Ring finger also appears inflamed. Extremities - peripheral pulses normal, no pedal edema, no clubbing or cyanosis Diabetic Foot Exam - Simple   Simple Foot Form Diabetic Foot exam was performed with the following findings: Yes 04/20/2024 10:16 AM  Visual Inspection No deformities, no ulcerations, no other skin breakdown bilaterally: Yes Sensation Testing Intact to touch and monofilament testing bilaterally: Yes Pulse Check Posterior Tibialis and Dorsalis pulse intact bilaterally: Yes Comments         Latest Ref Rng & Units 04/30/2022    9:54 AM 02/29/2020    3:25 PM  CMP  Glucose 70 - 99 mg/dL 87  694   BUN 8 - 27 mg/dL 11  13   Creatinine 9.42 - 1.00 mg/dL 9.35  9.30   Sodium 865 - 144 mmol/L 140  135   Potassium 3.5 - 5.2 mmol/L 3.4  3.9   Chloride 96 - 106 mmol/L 97  96   CO2 20 - 29 mmol/L 29  27   Calcium  8.7 - 10.3 mg/dL 9.5  9.5   Total Protein 6.0 - 8.5 g/dL 6.7  6.8   Total Bilirubin 0.0 - 1.2 mg/dL 0.3  <9.7   Alkaline Phos 44 - 121 IU/L 92  93   AST 0 - 40 IU/L 13  13   ALT 0 - 32 IU/L 12  13    Lipid Panel     Component Value Date/Time   CHOL 146 04/30/2022 0954   TRIG 78 04/30/2022 0954   HDL 36 (L) 04/30/2022 0954   CHOLHDL 4.1  04/30/2022 0954   LDLCALC 95 04/30/2022 0954    CBC    Component Value Date/Time   WBC 8.3 02/29/2020 1525   RBC 4.73 02/29/2020 1525   HGB 14.0 02/29/2020 1525   HCT 43.1 02/29/2020 1525   PLT 323 02/29/2020 1525   MCV 91  02/29/2020 1525   MCH 29.6 02/29/2020 1525   MCHC 32.5 02/29/2020 1525   RDW 12.8 02/29/2020 1525    ASSESSMENT AND PLAN: 1. Type 2 diabetes mellitus with obesity (HCC) (Primary) A1c is closer to goal.  Our goal is to get her below 7.  Should be on Jardiance  25 mg but was still receiving the 10 mg.  I will send an updated prescription to the patient assistance program that she gets the Jardiance  through.  I also completed the patient assistance program form for her to give to our phar tech in the pharmacy.  -Continue metformin  and Lantus  insulin  17 units daily. Encouraged her to eliminate the sugary drinks from the diet. - POCT glycosylated hemoglobin (Hb A1C) - POCT glucose (manual entry)  2. Diabetes mellitus treated with oral medication (HCC) 3. Insulin  long-term use (HCC) See #1 above.  4. Hyperlipidemia associated with type 2 diabetes mellitus (HCC) Continue atorvastatin  40 mg daily.  5. Hypertension associated with diabetes (HCC) Whom HCTZ 25 mg daily and lisinopril  5 mg daily  6. Encounter for screening mammogram for malignant neoplasm of breast Will give mammogram scholarship - MM 3D SCREENING MAMMOGRAM BILATERAL BREAST; Future  7. Need for influenza vaccination - Flu vaccine trivalent PF, 6mos and older(Flulaval,Afluria,Fluarix,Fluzone)  8. Hepatitis B immune Will check to make sure that she has been immunized - Hepatitis B surface antigen - Hepatitis B surface antibody,quantitative  9. Finger pain, right Initially wanted to give meloxicam but they interfere with Biktarvy.  Advised to use Tylenol  over-the-counter instead - Sedimentation Rate - Rheumatoid factor    Patient was given the opportunity to ask questions.  Patient verbalized understanding of the plan and was able to repeat key elements of the plan.   This documentation was completed using Paediatric nurse.  Any transcriptional errors are unintentional.  Orders Placed This Encounter   Procedures   MM 3D SCREENING MAMMOGRAM BILATERAL BREAST   Flu vaccine trivalent PF, 6mos and older(Flulaval,Afluria,Fluarix,Fluzone)   Hepatitis B surface antigen   Hepatitis B surface antibody,quantitative   Sedimentation Rate   Rheumatoid factor   POCT glycosylated hemoglobin (Hb A1C)   POCT glucose (manual entry)     Requested Prescriptions    No prescriptions requested or ordered in this encounter    Return in about 4 months (around 08/20/2024) for chronic ds management.  Barnie Louder, MD, FACP

## 2024-04-20 NOTE — Patient Instructions (Signed)
 VISIT SUMMARY:  Today, you had a follow-up visit to manage your diabetes, hypertension, and hyperlipidemia. We also discussed your HIV management, right index finger pain, and general health maintenance.  YOUR PLAN:  -TYPE 2 DIABETES MELLITUS: Type 2 diabetes is a condition where your body does not use insulin  properly, leading to high blood sugar levels. Your HbA1c has improved to 7.0%. We will increase your Jardiance  to 25 mg daily and submit a patient assistance form to the pharmacy. Please reduce sugary drinks and fast food, plan meals, cook at home, monitor your blood sugar consistently, and try to exercise, including using a stationary bike.  -ESSENTIAL HYPERTENSION: Hypertension is high blood pressure. Your blood pressure is excellent at 107/69 mmHg. Continue taking your current medications and limit your salt intake.  -HYPERLIPIDEMIA: Hyperlipidemia is having high levels of fats in your blood. Continue taking atorvastatin  40 mg daily.  -HIV INFECTION: HIV is a virus that attacks the immune system. Your viral load is undetectable. Continue taking Biktarvy as prescribed and follow up with your infectious disease specialist.  -RIGHT INDEX FINGER PAIN AND INFLAMMATION: You have pain and inflammation in your right index finger, possibly from braiding hair. Take Tylenol  over the counter as needed.   -GENERAL HEALTH MAINTENANCE: You are overdue for colon cancer screening and need to complete and return the stool kit. Your last mammogram was in November 2023. We will order a new mammogram, administer a flu shot today, check your hepatitis B status, and advise you to get a COVID booster at an outside pharmacy.  INSTRUCTIONS:  Please follow up with the pharmacy to ensure you receive the correct dosage of Jardiance . Complete and return the stool kit for colon cancer screening. Schedule and complete your mammogram. Get your flu shot today, and check your hepatitis B status. Obtain a COVID booster at  an outside pharmacy. If your right index finger pain does not improve with meloxicam, we may need to do an x-ray.

## 2024-04-21 LAB — RHEUMATOID FACTOR: Rheumatoid fact SerPl-aCnc: <10 [IU]/mL (ref <14.0)

## 2024-04-21 LAB — SEDIMENTATION RATE: Sed Rate: 7 mm/h (ref 0–40)

## 2024-04-21 LAB — HEPATITIS B SURFACE ANTIGEN: Hepatitis B Surface Ag: NEGATIVE

## 2024-04-21 LAB — HEPATITIS B SURFACE ANTIBODY, QUANTITATIVE: Hepatitis B Surf Ab Quant: 32.9 m[IU]/mL

## 2024-04-22 ENCOUNTER — Ambulatory Visit: Payer: Self-pay | Admitting: Internal Medicine

## 2024-04-24 ENCOUNTER — Other Ambulatory Visit: Payer: Self-pay

## 2024-04-26 ENCOUNTER — Other Ambulatory Visit: Payer: Self-pay | Admitting: Internal Medicine

## 2024-04-26 ENCOUNTER — Other Ambulatory Visit: Payer: Self-pay

## 2024-04-26 MED ORDER — DICLOFENAC SODIUM 1 % EX GEL
2.0000 g | Freq: Two times a day (BID) | CUTANEOUS | 0 refills | Status: AC | PRN
  Filled 2024-04-26: qty 100, 30d supply, fill #0

## 2024-04-27 ENCOUNTER — Other Ambulatory Visit: Payer: Self-pay

## 2024-05-01 ENCOUNTER — Other Ambulatory Visit: Payer: Self-pay

## 2024-05-08 ENCOUNTER — Other Ambulatory Visit: Payer: Self-pay

## 2024-06-05 ENCOUNTER — Telehealth: Payer: Self-pay

## 2024-06-05 ENCOUNTER — Other Ambulatory Visit: Payer: Self-pay

## 2024-06-05 NOTE — Telephone Encounter (Signed)
 Received notification from [facility] [employer]) regarding approval for JARDIANCE . Patient assistance approved from 04/26/2024 to 04/26/2025.  Medication will ship to [address], [place], [place] [address]  Pt ID: [id number]  Company phone: [phone]

## 2024-06-06 ENCOUNTER — Other Ambulatory Visit: Payer: Self-pay

## 2024-06-21 ENCOUNTER — Telehealth: Payer: Self-pay | Admitting: Internal Medicine

## 2024-06-21 NOTE — Telephone Encounter (Signed)
 Copied from CRM [id number]. Topic: Clinical - Medication Question >> Jun 16, 2024 10:00 AM Winona SAUNDERS wrote:  Monique from [employer]/ [employer] calling to inform the office they are not able to get in contact with the pt as the phone number they have on file is incorrect. I can not provide the correct phone number but he did verify name, dob, and addressed. Please contact the pt to get in contact with them or contact the offic e to assist with updating pt information. The application is incomplete and theve sent multiple letter to the pt. Call back phone number is [phone]

## 2024-06-21 NOTE — Telephone Encounter (Signed)
 Attempted to contact Patient's sister Annitta Portugal, unable to reach, left voicemail for a call back.

## 2024-06-30 ENCOUNTER — Other Ambulatory Visit: Payer: Self-pay | Admitting: Internal Medicine

## 2024-06-30 ENCOUNTER — Other Ambulatory Visit: Payer: Self-pay

## 2024-06-30 DIAGNOSIS — E119 Type 2 diabetes mellitus without complications: Secondary | ICD-10-CM

## 2024-06-30 MED ORDER — LANTUS SOLOSTAR 100 UNIT/ML ~~LOC~~ SOPN
17.0000 [IU] | PEN_INJECTOR | Freq: Every day | SUBCUTANEOUS | 0 refills | Status: DC
  Filled 2024-06-30: qty 6, 35d supply, fill #0

## 2024-06-30 MED ORDER — TRUEPLUS 5-BEVEL PEN NEEDLES 31G X 8 MM MISC
1 refills | Status: AC
  Filled 2024-06-30: qty 100, 100d supply, fill #0

## 2024-07-03 ENCOUNTER — Other Ambulatory Visit: Payer: Self-pay

## 2024-07-19 ENCOUNTER — Other Ambulatory Visit: Payer: Self-pay

## 2024-08-21 ENCOUNTER — Other Ambulatory Visit: Payer: Self-pay

## 2024-08-21 ENCOUNTER — Encounter: Payer: Self-pay | Admitting: Internal Medicine

## 2024-08-21 ENCOUNTER — Ambulatory Visit: Payer: Self-pay | Attending: Internal Medicine | Admitting: Internal Medicine

## 2024-08-21 VITALS — BP 116/69 | HR 58 | Ht 62.0 in | Wt 196.0 lb

## 2024-08-21 DIAGNOSIS — Z6835 Body mass index (BMI) 35.0-35.9, adult: Secondary | ICD-10-CM

## 2024-08-21 DIAGNOSIS — Z1231 Encounter for screening mammogram for malignant neoplasm of breast: Secondary | ICD-10-CM

## 2024-08-21 DIAGNOSIS — E785 Hyperlipidemia, unspecified: Secondary | ICD-10-CM

## 2024-08-21 DIAGNOSIS — E669 Obesity, unspecified: Secondary | ICD-10-CM

## 2024-08-21 DIAGNOSIS — Z794 Long term (current) use of insulin: Secondary | ICD-10-CM

## 2024-08-21 DIAGNOSIS — Z21 Asymptomatic human immunodeficiency virus [HIV] infection status: Secondary | ICD-10-CM

## 2024-08-21 DIAGNOSIS — I152 Hypertension secondary to endocrine disorders: Secondary | ICD-10-CM

## 2024-08-21 DIAGNOSIS — Z1211 Encounter for screening for malignant neoplasm of colon: Secondary | ICD-10-CM

## 2024-08-21 DIAGNOSIS — E1169 Type 2 diabetes mellitus with other specified complication: Secondary | ICD-10-CM

## 2024-08-21 DIAGNOSIS — M79644 Pain in right finger(s): Secondary | ICD-10-CM

## 2024-08-21 DIAGNOSIS — I10 Essential (primary) hypertension: Secondary | ICD-10-CM

## 2024-08-21 DIAGNOSIS — E119 Type 2 diabetes mellitus without complications: Secondary | ICD-10-CM

## 2024-08-21 DIAGNOSIS — Z7984 Long term (current) use of oral hypoglycemic drugs: Secondary | ICD-10-CM

## 2024-08-21 LAB — POCT GLYCOSYLATED HEMOGLOBIN (HGB A1C): HbA1c, POC (controlled diabetic range): 6.6 % (ref 0.0–7.0)

## 2024-08-21 LAB — GLUCOSE, POCT (MANUAL RESULT ENTRY): POC Glucose: 134 mg/dL — AB (ref 70–99)

## 2024-08-21 MED ORDER — POTASSIUM CHLORIDE ER 8 MEQ PO TBCR
8.0000 meq | EXTENDED_RELEASE_TABLET | Freq: Every day | ORAL | 1 refills | Status: AC
  Filled 2024-08-21: qty 90, 90d supply, fill #0

## 2024-08-21 MED ORDER — LISINOPRIL 5 MG PO TABS
5.0000 mg | ORAL_TABLET | Freq: Every day | ORAL | 2 refills | Status: AC
  Filled 2024-08-21: qty 90, 90d supply, fill #0

## 2024-08-21 MED ORDER — ATORVASTATIN CALCIUM 40 MG PO TABS
40.0000 mg | ORAL_TABLET | Freq: Every day | ORAL | 1 refills | Status: AC
  Filled 2024-08-21: qty 90, 90d supply, fill #0

## 2024-08-21 MED ORDER — METFORMIN HCL 1000 MG PO TABS
1000.0000 mg | ORAL_TABLET | Freq: Two times a day (BID) | ORAL | 2 refills | Status: AC
  Filled 2024-08-21: qty 180, 90d supply, fill #0

## 2024-08-21 MED ORDER — EMPAGLIFLOZIN 25 MG PO TABS
25.0000 mg | ORAL_TABLET | Freq: Every day | ORAL | 1 refills | Status: AC
  Filled 2024-08-21: qty 90, 90d supply, fill #0

## 2024-08-21 MED ORDER — HYDROCHLOROTHIAZIDE 25 MG PO TABS
25.0000 mg | ORAL_TABLET | Freq: Every day | ORAL | 2 refills | Status: AC
  Filled 2024-08-21: qty 90, 90d supply, fill #0

## 2024-08-21 MED ORDER — LANTUS SOLOSTAR 100 UNIT/ML ~~LOC~~ SOPN
17.0000 [IU] | PEN_INJECTOR | Freq: Every day | SUBCUTANEOUS | 6 refills | Status: AC
  Filled 2024-08-21: qty 6, 35d supply, fill #0

## 2024-08-21 NOTE — Progress Notes (Signed)
 "   Patient ID: Jennifer Ford, female    DOB: Sep 24, 1960  MRN: [id number]  CC: Diabetes (DM & HTN f/u./Concern of swelling / inflammatin of finger on RT hand /Already received flu vax. Yes to mammogram. Has stool kit. )   Subjective: Jennifer Ford is a 64 y.o. female who presents for chronic ds management. Her concerns today include:  Pt with hx of HTN, DM with micoralbumin/polyneuropathy, obesity, HL, HIV.    Discussed the use of AI scribe software for clinical note transcription with the patient, who gave verbal consent to proceed.  History of Present Illness Jennifer Ford is a 64 year old female with diabetes, hypertension, and HIV who presents for follow-up of her chronic conditions and right index finger swelling and pain.  She experiences persistent swelling in her right index finger with intermittent pain that fluctuates in intensity, lasting two to three days before subsiding. There is no known injury to the finger. Val for the swelling on last visit but was not having any pain at that time. Blood tests for rheumatoid factor and sedimentation rate, were negative. Flares occur in overcast weather she has noticed. She has tried diclofenac  gel without relief, while Tylenol  arthritis provides some pain relief.  DM:  Results for orders placed or performed in visit on 08/21/24  POCT glucose (manual entry)   Collection Time: 08/21/24  9:41 AM  Result Value Ref Range   POC Glucose 134 (A) 70 - 99 mg/dl  POCT glycosylated hemoglobin (Hb A1C)   Collection Time: 08/21/24  9:41 AM  Result Value Ref Range   Hemoglobin A1C     HbA1c POC (<> result, manual entry)     HbA1c, POC (prediabetic range)     HbA1c, POC (controlled diabetic range) 6.6 0.0 - 7.0 %  Her diabetes management includes Jardiance  25 mg daily, metformin  1000 mg twice daily, and Lantus  insulin  17 units daily. Her last A1c was 6.6, improved from 7.0. She does not regularly check her blood sugars and has noted some vision changes,  with a previous eye exam indicating cataracts. In terms of lifestyle, she is trying to improve her diet by eating more fruits and some vegetables, though she admits to not cooking much. Her weight has remained stable at 196 pounds. She stays active by caring for her mother and does not engage in structured exercise.  HTN: she takes lisinopril  5 mg daily and hydrochlorothiazide  25 mg daily. She tries to limit salt intake, using 'no salt' products. Her blood pressure was recorded as 160/69 today. No chest pain, shortness of breath, or leg swelling.  Her cholesterol management includes atorvastatin  40 mg daily. She has not had a cholesterol check in over a year.  She is on Biktarvy for HIV, with her last labs showing undetectable levels in July. She continues to take her medication daily and follows with [facility]  HM: Overdue for colon cancer screening.  She still has fit test at home which she has not used.  She promises to use today) Referred for mammogram on last visit.  Patient states she was never called.      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) 11/08/2023   Microalbuminuria 02/29/2020   Positive depression screening 02/29/2020   Hyperlipidemia associated with type 2 diabetes mellitus (HCC) 02/29/2020   Hypertension associated with diabetes (HCC) 02/29/2020   Type 2 diabetes mellitus with diabetic polyneuropathy,  without long-term current use of insulin  (HCC) 02/29/2020   Asymptomatic HIV infection (HCC) 02/29/2020     Medications Ordered Prior to Encounter[1]  Allergies[2]  Social History   Socioeconomic History   Marital status: Single    Spouse name: Not on file   Number of children: 2   Years of education: Not on file   Highest education level: Not on file  Occupational History   Not on file  Tobacco Use   Smoking status: Never   Smokeless tobacco: Never  Vaping Use    Vaping status: Never Used  Substance and Sexual Activity   Alcohol use: Yes    Comment: occasionally   Drug use: No   Sexual activity: Not on file  Other Topics Concern   Not on file  Social History Narrative   Not on file   Social Drivers of Health   Tobacco Use: Low Risk (04/20/2024)   Patient History    Smoking Tobacco Use: Never    Smokeless Tobacco Use: Never    Passive Exposure: Not on file  Financial Resource Strain: Low Risk (08/12/2023)   Overall Financial Resource Strain (CARDIA)    Difficulty of Paying Living Expenses: Not hard at all  Food Insecurity: No Food Insecurity (08/12/2023)   Hunger Vital Sign    Worried About Running Out of Food in the Last Year: Never true    Ran Out of Food in the Last Year: Never true  Transportation Needs: No Transportation Needs (08/12/2023)   PRAPARE - Administrator, Civil Service (Medical): No    Lack of Transportation (Non-Medical): No  Physical Activity: Insufficiently Active (08/12/2023)   Exercise Vital Sign    Days of Exercise per Week: 2 days    Minutes of Exercise per Session: 20 min  Stress: Stress Concern Present (08/12/2023)   [employer] of Occupational Health - Occupational Stress Questionnaire    Feeling of Stress : Rather much  Social Connections: Moderately Integrated (08/12/2023)   Social Connection and Isolation Panel    Frequency of Communication with Friends and Family: More than three times a week    Frequency of Social Gatherings with Friends and Family: Three times a week    Attends Religious Services: 1 to 4 times per year    Active Member of Clubs or Organizations: Yes    Attends Banker Meetings: 1 to 4 times per year    Marital Status: Never married  Intimate Partner Violence: Not At Risk (08/12/2023)   Humiliation, Afraid, Rape, and Kick questionnaire    Fear of Current or Ex-Partner: No    Emotionally Abused: No    Physically Abused: No    Sexually Abused: No   Depression (PHQ2-9): Low Risk (04/20/2024)   Depression (PHQ2-9)    PHQ-2 Score: 4  Alcohol Screen: Low Risk (08/12/2023)   Alcohol Screen    Last Alcohol Screening Score (AUDIT): 1  Housing: Low Risk (08/12/2023)   Housing Stability Vital Sign    Unable to Pay for Housing in the Last Year: No    Number of Times Moved in the Last Year: 0    Homeless in the Last Year: No  Utilities: Not At Risk (08/12/2023)   AHC Utilities    Threatened with loss of utilities: No  Health Literacy: Adequate Health Literacy (08/12/2023)   B1300 Health Literacy    Frequency of need for help with medical instructions: Rarely    Family History  Problem Relation Age of Onset  Diabetes Mother    Hypertension Mother    Hypertension Sister    Diabetes Maternal Grandmother    Diabetes Maternal Grandfather     Past Surgical History:  Procedure Laterality Date   ABDOMINAL HYSTERECTOMY     BREAST REDUCTION SURGERY Bilateral     ROS: Review of Systems Negative except as stated above  PHYSICAL EXAM: BP 116/69 (BP Location: Left Arm, Patient Position: Sitting, Cuff Size: Normal)   Pulse (!) 58   Ht 5' 2 (1.575 m)   Wt 196 lb (88.9 kg)   SpO2 98%   BMI 35.85 kg/m   Wt Readings from Last 3 Encounters:  08/21/24 196 lb (88.9 kg)  04/20/24 195 lb (88.5 kg)  12/16/23 198 lb (89.8 kg)    Physical Exam General appearance - alert, well appearing, and in no distress Mental status - normal mood, behavior, speech, dress, motor activity, and thought processes Neck - supple, no significant adenopathy Chest - clear to auscultation, no wheezes, rales or rhonchi, symmetric air entry Heart - normal rate, regular rhythm, normal S1, S2, no murmurs, rubs, clicks or gallops Extremities - peripheral pulses normal, no pedal edema, no clubbing or cyanosis MSK: RT index finger - entire finger is swollen with decrease movement at PIP and DIP jts. No erythema. No tenderness on palpation or with  movement         Latest Ref Rng & Units 04/30/2022    9:54 AM 02/29/2020    3:25 PM  CMP  Glucose 70 - 99 mg/dL 87  694   BUN 8 - 27 mg/dL 11  13   Creatinine 9.42 - 1.00 mg/dL 9.35  9.30   Sodium 865 - 144 mmol/L 140  135   Potassium 3.5 - 5.2 mmol/L 3.4  3.9   Chloride 96 - 106 mmol/L 97  96   CO2 20 - 29 mmol/L 29  27   Calcium  8.7 - 10.3 mg/dL 9.5  9.5   Total Protein 6.0 - 8.5 g/dL 6.7  6.8   Total Bilirubin 0.0 - 1.2 mg/dL 0.3  <9.7   Alkaline Phos 44 - 121 IU/L 92  93   AST 0 - 40 IU/L 13  13   ALT 0 - 32 IU/L 12  13    Lipid Panel     Component Value Date/Time   CHOL 146 04/30/2022 0954   TRIG 78 04/30/2022 0954   HDL 36 (L) 04/30/2022 0954   CHOLHDL 4.1 04/30/2022 0954   LDLCALC 95 04/30/2022 0954    CBC    Component Value Date/Time   WBC 8.3 02/29/2020 1525   RBC 4.73 02/29/2020 1525   HGB 14.0 02/29/2020 1525   HCT 43.1 02/29/2020 1525   PLT 323 02/29/2020 1525   MCV 91 02/29/2020 1525   MCH 29.6 02/29/2020 1525   MCHC 32.5 02/29/2020 1525   RDW 12.8 02/29/2020 1525    ASSESSMENT AND PLAN: 1. Type 2 diabetes mellitus in patient with obesity (HCC) (Primary) At goal.  Continue Lantus  insulin  17 units Jardiance  25 mg daily and metformin  1 g twice a day. Commended on changes that she has made in her eating habits so far.  Encouraged her to try to get in some vegetables every day with one of her meals. - POCT glucose (manual entry) - POCT glycosylated hemoglobin (Hb A1C) - empagliflozin  (JARDIANCE ) 25 MG TABS tablet; Take 1 tablet (25 mg total) by mouth daily before breakfast.  Dispense: 90 tablet; Refill: 1  2. Insulin   long-term use (HCC) 3. Diabetes mellitus treated with oral medication (HCC) See #1 above.  4. Finger pain, right Osteoarthritis versus an inflammatory arthritis.  Will get an x-ray of the finger. Continue Tylenol  as needed.  Recommend trying ibuprofen 400 mg with the Tylenol  when needed. - DG Hand Complete Right; Future -  Microalbumin / creatinine urine ratio  5. Hyperlipidemia associated with type 2 diabetes mellitus (HCC) Continue atorvastatin .  Will check lipid profile today. - atorvastatin  (LIPITOR) 40 MG tablet; Take 1 tablet (40 mg total) by mouth daily.  Dispense: 90 tablet; Refill: 1 - Lipid panel  6. Hypertension associated with diabetes (HCC) At goal.  Continue lisinopril  5 mg daily and HCTZ 25 mg daily - hydrochlorothiazide  (HYDRODIURIL ) 25 MG tablet; Take 1 tablet (25 mg total) by mouth daily.  Dispense: 90 tablet; Refill: 2 - lisinopril  (ZESTRIL ) 5 MG tablet; Take 1 tablet (5 mg total) by mouth daily.  Dispense: 90 tablet; Refill: 2 - potassium chloride  (KLOR-CON ) 8 MEQ tablet; Take 1 tablet (8 mEq total) by mouth daily.  Dispense: 90 tablet; Refill: 1 - Basic Metabolic Panel  7. Asymptomatic HIV infection (HCC) Stable on Biktarvy.  All labs that were done [facility] July 2025 reviewed.  8. Screening for colon cancer Strongly encouraged her to use the fit test and turn it in for colon cancer screening.  9. Encounter for screening mammogram for malignant neoplasm of breast -Mammogram scholarship given today. - MM 3D SCREENING MAMMOGRAM BILATERAL BREAST; Future   Patient was given the opportunity to ask questions.  Patient verbalized understanding of the plan and was able to repeat key elements of the plan.   This documentation was completed using Paediatric nurse.  Any transcriptional errors are unintentional.  Orders Placed This Encounter  Procedures   DG Hand Complete Right   MM 3D SCREENING MAMMOGRAM BILATERAL BREAST   Lipid panel   Microalbumin / creatinine urine ratio   Basic Metabolic Panel   POCT glucose (manual entry)   POCT glycosylated hemoglobin (Hb A1C)     Requested Prescriptions   Signed Prescriptions Disp Refills   atorvastatin  (LIPITOR) 40 MG tablet 90 tablet 1    Sig: Take 1 tablet (40 mg total) by mouth daily.    hydrochlorothiazide  (HYDRODIURIL ) 25 MG tablet 90 tablet 2    Sig: Take 1 tablet (25 mg total) by mouth daily.   insulin  glargine (LANTUS  SOLOSTAR) 100 UNIT/ML Solostar Pen 15 mL 6    Sig: Inject 17 Units into the skin at bedtime.   lisinopril  (ZESTRIL ) 5 MG tablet 90 tablet 2    Sig: Take 1 tablet (5 mg total) by mouth daily.   metFORMIN  (GLUCOPHAGE ) 1000 MG tablet 180 tablet 2    Sig: Take 1 tablet (1,000 mg total) by mouth 2 (two) times daily with a meal.   potassium chloride  (KLOR-CON ) 8 MEQ tablet 90 tablet 1    Sig: Take 1 tablet (8 mEq total) by mouth daily.   empagliflozin  (JARDIANCE ) 25 MG TABS tablet 90 tablet 1    Sig: Take 1 tablet (25 mg total) by mouth daily before breakfast.    Return in about 4 months (around 12/19/2024).  Barnie Louder, MD, FACP     [1]  Current Outpatient Medications on File Prior to Visit  Medication Sig Dispense Refill   bictegravir-emtricitabine-tenofovir AF (BIKTARVY) 50-200-25 MG TABS tablet Take 1 tablet by mouth daily.     Blood Glucose Monitoring Suppl (TRUE METRIX METER) w/Device KIT Use  to check blood sugar three times daily. Dx code E11.69 1 kit 0   cholecalciferol (VITAMIN D) 1000 UNITS tablet Take 1,000 Units by mouth daily.     glucose blood (TRUE METRIX BLOOD GLUCOSE TEST) test strip Use to check blood sugar three times daily. Dx code E11.69 100 each 12   Insulin  Pen Needle (TRUEPLUS 5-BEVEL PEN NEEDLES) 31G X 8 MM MISC Use to inject once daily. 100 each 1   Multiple Vitamin (MULTIVITAMIN WITH MINERALS) TABS tablet Take 1 tablet by mouth daily.     TRUEplus Lancets 28G MISC Use to check blood sugar three times daily. Dx code E11.69 100 each 4   diclofenac  Sodium (VOLTAREN ) 1 % GEL Apply 2 g topically 2 (two) times daily as needed (to apply to inflamed finger). (Patient not taking: Reported on 08/21/2024) 100 g 0   No current facility-administered medications on file prior to visit.  [2]  Allergies Allergen Reactions   Aspirin Nausea  And Vomiting   "

## 2024-08-21 NOTE — Patient Instructions (Signed)
" °  VISIT SUMMARY: Today, you came in for a follow-up on your chronic conditions and to address the swelling and pain in your right index finger. We discussed your diabetes, hypertension, HIV management, and general health maintenance. You also mentioned some vision changes and your efforts to improve your diet and stay active.  YOUR PLAN: -OSTEOARTHRITIS OF THE RIGHT INDEX FINGER: Osteoarthritis is a condition where the protective cartilage that cushions the ends of your bones wears down over time. We have ordered an x-ray of your right hand to get a better look at the affected area. You should stop using the diclofenac  gel as it was not effective. Instead, you can take ibuprofen 400 mg in combination with Tylenol  for better pain management but try not to take Ibuprofen every day.  -TYPE 2 DIABETES MELLITUS: Type 2 diabetes is a condition that affects the way your body processes blood sugar. Your A1c has improved to 6.6, which is good. Continue taking Jardiance  25 mg daily, metformin  1000 mg twice daily, and Lantus  insulin  17 units daily. Keep working on improving your diet, especially by eating more vegetables.  -HYPERTENSION: Hypertension, or high blood pressure, is a condition where the force of the blood against your artery walls is too high. Your blood pressure is well-controlled at 160/69 mmHg. Continue taking lisinopril  5 mg daily and hydrochlorothiazide  25 mg daily. Keep up your efforts to limit salt intake.  -HYPERLIPIDEMIA: Hyperlipidemia is a condition where you have high levels of fats (lipids) in your blood. You haven't had your cholesterol checked in over a year, so we have ordered a cholesterol panel. Continue taking atorvastatin  40 mg daily.  -OBESITY: Obesity is a condition where you have an excessive amount of body fat. Your weight is stable at 196 lbs, which is slightly overweight for your height. Try to increase your physical activity, such as walking for 30 minutes a  day.  -ASYMPTOMATIC HIV INFECTION: Asymptomatic HIV infection means you have HIV but do not show symptoms. Your HIV viral load is undetectable as of July. Continue taking Biktarvy as prescribed.  -GENERAL HEALTH MAINTENANCE: For your general health, please use and return the colon cancer screening kit today. We have reordered your mammogram and provided a mammogram scholarship. We also discussed the tdap vaccine, which is recommended every 10 years.  INSTRUCTIONS: Please follow up with the x-ray of your right hand and return the colon cancer screening kit today. We have also reordered your mammogram, so please schedule and complete it. Continue with your current medications and lifestyle changes, and we will review your cholesterol levels once the panel results are in.                      Contains text generated by [employer]bridge.                                 Contains text generated by [employer]bridge.   "

## 2024-08-22 LAB — BASIC METABOLIC PANEL WITH GFR
BUN/Creatinine Ratio: 14 (ref 12–28)
BUN: 11 mg/dL (ref 8–27)
CO2: 26 mmol/L (ref 20–29)
Calcium: 9.8 mg/dL (ref 8.7–10.3)
Chloride: 99 mmol/L (ref 96–106)
Creatinine, Ser: 0.78 mg/dL (ref 0.57–1.00)
Glucose: 117 mg/dL — ABNORMAL HIGH (ref 70–99)
Potassium: 3.5 mmol/L (ref 3.5–5.2)
Sodium: 139 mmol/L (ref 134–144)
eGFR: 86 mL/min/1.73

## 2024-08-22 LAB — LIPID PANEL
Chol/HDL Ratio: 4.6 ratio — ABNORMAL HIGH (ref 0.0–4.4)
Cholesterol, Total: 174 mg/dL (ref 100–199)
HDL: 38 mg/dL — ABNORMAL LOW
LDL Chol Calc (NIH): 118 mg/dL — ABNORMAL HIGH (ref 0–99)
Triglycerides: 97 mg/dL (ref 0–149)
VLDL Cholesterol Cal: 18 mg/dL (ref 5–40)

## 2024-08-22 LAB — MICROALBUMIN / CREATININE URINE RATIO
Creatinine, Urine: 40.3 mg/dL
Microalb/Creat Ratio: <7 mg/g{creat} (ref 0–29)
Microalbumin, Urine: <3 ug/mL

## 2024-08-23 ENCOUNTER — Other Ambulatory Visit: Payer: Self-pay | Admitting: Internal Medicine

## 2024-08-23 ENCOUNTER — Ambulatory Visit: Payer: Self-pay | Admitting: Internal Medicine

## 2024-08-23 NOTE — Progress Notes (Signed)
 Let patient know that her cholesterol level is elevated at 118 with goal being less than 70.  Please confirm that she has been taking the atorvastatin  40 mg daily consistently over the past 2 months.  If she has been doing so, then we need to increase the dose to 60 mg daily which means she will take 1-1/2 of the 40 mg dose.  Please let me know her response so that I know whether to send an updated prescription to her pharmacy. -Kidney function is good. -No abnormal amounts of protein in the urine.

## 2024-08-23 NOTE — Telephone Encounter (Signed)
 Copied from CRM #[id number]. Topic: Clinical - Lab/Test Results >> Aug 23, 2024 10:29 AM Ivette P wrote:  Reason for CRM: pt called in due to missed call about labs. Received call from [facility] regarding labs. Called [facility] and was advsied by Zara that Clarisa will reach out to pt to discuss results and any questions needing to be answered.    Pt will be expecting

## 2024-08-24 ENCOUNTER — Ambulatory Visit: Payer: Self-pay | Admitting: Internal Medicine

## 2024-08-24 LAB — FECAL OCCULT BLOOD, IMMUNOCHEMICAL: Fecal Occult Bld: NEGATIVE

## 2024-08-28 ENCOUNTER — Other Ambulatory Visit: Payer: Self-pay

## 2024-09-14 ENCOUNTER — Ambulatory Visit
Admission: RE | Admit: 2024-09-14 | Discharge: 2024-09-14 | Disposition: A | Discharge status: Home / Self Care | Source: Ambulatory Visit | Attending: Internal Medicine | Admitting: Internal Medicine

## 2024-09-14 ENCOUNTER — Encounter

## 2024-09-14 DIAGNOSIS — Z1231 Encounter for screening mammogram for malignant neoplasm of breast: Secondary | ICD-10-CM

## 2024-12-21 ENCOUNTER — Ambulatory Visit: Payer: Self-pay | Admitting: Internal Medicine
